# Patient Record
Sex: Male | Born: 1965 | Race: White | Hispanic: No | Marital: Single | Smoking: Current every day smoker
Health system: Southern US, Community
[De-identification: ages and names within clinical notes are randomized; demographics above are authoritative.]

## PROBLEM LIST (undated history)

## (undated) DIAGNOSIS — M5136 Other intervertebral disc degeneration, lumbar region: Secondary | ICD-10-CM

## (undated) DIAGNOSIS — M1990 Unspecified osteoarthritis, unspecified site: Secondary | ICD-10-CM

## (undated) DIAGNOSIS — Z973 Presence of spectacles and contact lenses: Secondary | ICD-10-CM

## (undated) DIAGNOSIS — I10 Essential (primary) hypertension: Secondary | ICD-10-CM

## (undated) DIAGNOSIS — J439 Emphysema, unspecified: Secondary | ICD-10-CM

## (undated) DIAGNOSIS — M779 Enthesopathy, unspecified: Secondary | ICD-10-CM

## (undated) DIAGNOSIS — IMO0001 Reserved for inherently not codable concepts without codable children: Secondary | ICD-10-CM

## (undated) DIAGNOSIS — G8929 Other chronic pain: Secondary | ICD-10-CM

## (undated) DIAGNOSIS — M549 Dorsalgia, unspecified: Secondary | ICD-10-CM

## (undated) HISTORY — PX: FRACTURE SURGERY: SHX138

## (undated) HISTORY — PX: HERNIA REPAIR: SHX51

## (undated) HISTORY — PX: MULTIPLE TOOTH EXTRACTIONS: SHX2053

## (undated) HISTORY — PX: ANTERIOR CERVICAL DECOMP/DISCECTOMY FUSION: SHX1161

## (undated) HISTORY — PX: BACK SURGERY: SHX140

---

## 2014-10-19 ENCOUNTER — Other Ambulatory Visit: Payer: Self-pay | Admitting: Neurosurgery

## 2014-10-19 DIAGNOSIS — M5136 Other intervertebral disc degeneration, lumbar region: Secondary | ICD-10-CM

## 2014-10-26 ENCOUNTER — Ambulatory Visit
Admission: RE | Admit: 2014-10-26 | Discharge: 2014-10-26 | Disposition: A | Discharge status: Home / Self Care | Payer: Medicaid Other | Source: Ambulatory Visit | Attending: Neurosurgery | Admitting: Neurosurgery

## 2014-10-26 DIAGNOSIS — M5136 Other intervertebral disc degeneration, lumbar region: Secondary | ICD-10-CM

## 2014-10-26 MED ORDER — ONDANSETRON HCL 4 MG/2ML IJ SOLN
4.0000 mg | Freq: Once | INTRAMUSCULAR | Status: AC
  Administered 2014-10-26: 4 mg via INTRAMUSCULAR

## 2014-10-26 MED ORDER — DIAZEPAM 5 MG PO TABS
10.0000 mg | ORAL_TABLET | Freq: Once | ORAL | Status: AC
  Administered 2014-10-26: 10 mg via ORAL

## 2014-10-26 MED ORDER — MEPERIDINE HCL 100 MG/ML IJ SOLN
75.0000 mg | Freq: Once | INTRAMUSCULAR | Status: AC
  Administered 2014-10-26: 75 mg via INTRAMUSCULAR

## 2014-10-26 MED ORDER — IOHEXOL 180 MG/ML  SOLN
15.0000 mL | Freq: Once | INTRAMUSCULAR | Status: AC | PRN
  Administered 2014-10-26: 15 mL via INTRATHECAL

## 2014-10-26 NOTE — Discharge Instructions (Signed)

## 2014-10-28 ENCOUNTER — Other Ambulatory Visit: Payer: Self-pay | Admitting: Neurosurgery

## 2014-11-01 ENCOUNTER — Encounter (HOSPITAL_COMMUNITY): Payer: Self-pay | Admitting: *Deleted

## 2014-11-01 MED ORDER — CEFAZOLIN SODIUM-DEXTROSE 2-3 GM-% IV SOLR
2.0000 g | INTRAVENOUS | Status: AC
  Administered 2014-11-02: 2 g via INTRAVENOUS
  Filled 2014-11-01: qty 50

## 2014-11-01 MED ORDER — MUPIROCIN 2 % EX OINT
1.0000 "application " | TOPICAL_OINTMENT | Freq: Once | CUTANEOUS | Status: AC
  Administered 2014-11-02: 1 via TOPICAL
  Filled 2014-11-01: qty 22

## 2014-11-01 NOTE — Progress Notes (Signed)
Pt denies SOB, chest pain, and being under the care of a cardiologist. Pt denies having an EKG and chest x ray w/i the last year. Pt denies having a stress test, echo, and cardiac cath. Pt made aware to stop taking Aspirin, vitamins and herbal medications. Do not take any NSAIDs ie: Ibuprofen, Advil, Naproxen or any medication containing Aspirin. LVM with Shanda BumpsJessica to make MD aware to sign orders. Please add weight and neck measurement to Sleep Apnea screening to make complete on DOS.

## 2014-11-01 NOTE — H&P (Signed)
Curtis HuxleyJames Benson is an 49 y.o. male.   Chief Complaint: pain in both legs.  HPI: patient seen with his girlfriend complaining of pain in both legs and weakness, being unable to extend or fles his toes. This problem has been present fot 18 months and worse lately.he was seen by a spine surgeon in [place] who told him about the ned for surgery but he declined because he is a smoker  Past Medical History  Diagnosis Date  . Arthritis   . DDD (degenerative disc disease), lumbar   . Wears glasses   . Tendonitis     RUE  . Emphysema of lung   . Chronic back pain     Past Surgical History  Procedure Laterality Date  . Fracture surgery      left ankle  . Back surgery      anterior cervical fusion  . Hernia repair    . Multiple tooth extractions      Family History  Problem Relation Age of Onset  . Diabetes Brother    Social History:  reports that he has been smoking.  He has never used smokeless tobacco. He reports that he drinks alcohol. He reports that he uses illicit drugs (Marijuana).  Allergies: No Known Allergies  No prescriptions prior to admission    No results found for this or any previous visit (from the past 48 hour(s)). No results found.  Review of Systems  Constitutional: Negative.   HENT: Negative.   Eyes: Negative.   Respiratory: Positive for shortness of breath.   Cardiovascular: Negative.   Gastrointestinal: Negative.   Genitourinary: Negative.   Musculoskeletal: Positive for back pain.  Skin: Negative.   Neurological: Positive for focal weakness.  Endo/Heme/Allergies: Negative.   Psychiatric/Behavioral: Positive for depression.    There were no vitals taken for this visit. Physical Exam came to my office with crutches. Hent,nl. Neck, anterior scar,  Cv, nl. Lungs, bilateral wheezing. Abdomen, nl. Extremities, nl. NEURO weakness of DF and PF of both feet. Dtr, 3 plus, no babinski. Lumbar mri shows stenosis from l2 to l5.negative for  spondylolisthesis Assessment/Plan Patient to go ahead with decompression from l2 to l5. He is aware of risks and benefits  Stefanie Hodgens M 11/01/2014, 7:08 PM

## 2014-11-02 ENCOUNTER — Inpatient Hospital Stay (HOSPITAL_COMMUNITY)
Admission: RE | Admit: 2014-11-02 | Discharge: 2014-11-05 | DRG: 515 | Disposition: A | Discharge status: Home Health | Payer: Medicaid Other | Source: Ambulatory Visit | Attending: Neurosurgery | Admitting: Neurosurgery

## 2014-11-02 ENCOUNTER — Encounter (HOSPITAL_COMMUNITY): Payer: Self-pay | Admitting: *Deleted

## 2014-11-02 ENCOUNTER — Inpatient Hospital Stay (HOSPITAL_COMMUNITY): Payer: Medicaid Other | Admitting: Anesthesiology

## 2014-11-02 ENCOUNTER — Encounter (HOSPITAL_COMMUNITY)
Admission: RE | Disposition: A | Discharge status: Home Health | Payer: Medicaid Other | Source: Ambulatory Visit | Attending: Neurosurgery

## 2014-11-02 ENCOUNTER — Inpatient Hospital Stay (HOSPITAL_COMMUNITY): Payer: Medicaid Other

## 2014-11-02 DIAGNOSIS — J438 Other emphysema: Secondary | ICD-10-CM | POA: Diagnosis present

## 2014-11-02 DIAGNOSIS — F1290 Cannabis use, unspecified, uncomplicated: Secondary | ICD-10-CM | POA: Diagnosis present

## 2014-11-02 DIAGNOSIS — M545 Low back pain: Secondary | ICD-10-CM | POA: Diagnosis present

## 2014-11-02 DIAGNOSIS — M48061 Spinal stenosis, lumbar region without neurogenic claudication: Secondary | ICD-10-CM | POA: Diagnosis present

## 2014-11-02 DIAGNOSIS — G9519 Other vascular myelopathies: Secondary | ICD-10-CM | POA: Diagnosis present

## 2014-11-02 DIAGNOSIS — M4806 Spinal stenosis, lumbar region: Principal | ICD-10-CM | POA: Diagnosis present

## 2014-11-02 DIAGNOSIS — Z419 Encounter for procedure for purposes other than remedying health state, unspecified: Secondary | ICD-10-CM

## 2014-11-02 DIAGNOSIS — Z833 Family history of diabetes mellitus: Secondary | ICD-10-CM

## 2014-11-02 DIAGNOSIS — F17210 Nicotine dependence, cigarettes, uncomplicated: Secondary | ICD-10-CM | POA: Diagnosis present

## 2014-11-02 DIAGNOSIS — R509 Fever, unspecified: Secondary | ICD-10-CM

## 2014-11-02 HISTORY — PX: LUMBAR LAMINECTOMY/DECOMPRESSION MICRODISCECTOMY: SHX5026

## 2014-11-02 HISTORY — DX: Enthesopathy, unspecified: M77.9

## 2014-11-02 HISTORY — DX: Unspecified osteoarthritis, unspecified site: M19.90

## 2014-11-02 HISTORY — DX: Emphysema, unspecified: J43.9

## 2014-11-02 HISTORY — DX: Other chronic pain: G89.29

## 2014-11-02 HISTORY — DX: Dorsalgia, unspecified: M54.9

## 2014-11-02 HISTORY — DX: Other intervertebral disc degeneration, lumbar region: M51.36

## 2014-11-02 HISTORY — DX: Presence of spectacles and contact lenses: Z97.3

## 2014-11-02 LAB — CBC
HEMATOCRIT: 47.6 % (ref 39.0–52.0)
HEMOGLOBIN: 15.8 g/dL (ref 13.0–17.0)
MCH: 29.8 pg (ref 26.0–34.0)
MCHC: 33.2 g/dL (ref 30.0–36.0)
MCV: 89.8 fL (ref 78.0–100.0)
Platelets: 245 10*3/uL (ref 150–400)
RBC: 5.3 MIL/uL (ref 4.22–5.81)
RDW: 13.4 % (ref 11.5–15.5)
WBC: 9.3 10*3/uL (ref 4.0–10.5)

## 2014-11-02 LAB — BASIC METABOLIC PANEL
ANION GAP: 7 (ref 5–15)
BUN: 6 mg/dL (ref 6–23)
CHLORIDE: 103 mmol/L (ref 96–112)
CO2: 29 mmol/L (ref 19–32)
CREATININE: 0.99 mg/dL (ref 0.50–1.35)
Calcium: 8.8 mg/dL (ref 8.4–10.5)
GFR calc non Af Amer: >90 mL/min (ref >90)
Glucose, Bld: 86 mg/dL (ref 70–99)
POTASSIUM: 4.7 mmol/L (ref 3.5–5.1)
Sodium: 139 mmol/L (ref 135–145)

## 2014-11-02 LAB — SURGICAL PCR SCREEN
MRSA, PCR: NEGATIVE
STAPHYLOCOCCUS AUREUS: NEGATIVE

## 2014-11-02 SURGERY — LUMBAR LAMINECTOMY/DECOMPRESSION MICRODISCECTOMY 4 LEVEL
Anesthesia: General | Site: Back

## 2014-11-02 MED ORDER — ROCURONIUM BROMIDE 50 MG/5ML IV SOLN
INTRAVENOUS | Status: AC
  Filled 2014-11-02: qty 1

## 2014-11-02 MED ORDER — MENTHOL 3 MG MT LOZG: 1.0000 | LOZENGE | OROMUCOSAL | Status: DC | PRN

## 2014-11-02 MED ORDER — VANCOMYCIN HCL 1000 MG IV SOLR
INTRAVENOUS | Status: AC
  Filled 2014-11-02: qty 2000

## 2014-11-02 MED ORDER — BUPIVACAINE LIPOSOME 1.3 % IJ SUSP
INTRAMUSCULAR | Status: DC | PRN
  Administered 2014-11-02: 20 mL

## 2014-11-02 MED ORDER — NALOXONE HCL 0.4 MG/ML IJ SOLN: 0.4000 mg | INTRAMUSCULAR | Status: DC | PRN

## 2014-11-02 MED ORDER — SODIUM CHLORIDE 0.9 % IJ SOLN
3.0000 mL | Freq: Two times a day (BID) | INTRAMUSCULAR | Status: DC
  Administered 2014-11-02 – 2014-11-05 (×6): 3 mL via INTRAVENOUS

## 2014-11-02 MED ORDER — FENTANYL CITRATE 0.05 MG/ML IJ SOLN
INTRAMUSCULAR | Status: AC
  Filled 2014-11-02: qty 5

## 2014-11-02 MED ORDER — FENTANYL CITRATE 0.05 MG/ML IJ SOLN
INTRAMUSCULAR | Status: DC | PRN
  Administered 2014-11-02: 50 ug via INTRAVENOUS
  Administered 2014-11-02 (×2): 100 ug via INTRAVENOUS

## 2014-11-02 MED ORDER — ALBUTEROL SULFATE (2.5 MG/3ML) 0.083% IN NEBU: 2.5000 mg | INHALATION_SOLUTION | RESPIRATORY_TRACT | Status: DC | PRN

## 2014-11-02 MED ORDER — DIPHENHYDRAMINE HCL 12.5 MG/5ML PO ELIX: 12.5000 mg | ORAL_SOLUTION | Freq: Four times a day (QID) | ORAL | Status: DC | PRN

## 2014-11-02 MED ORDER — PHENOL 1.4 % MT LIQD: 1.0000 | OROMUCOSAL | Status: DC | PRN

## 2014-11-02 MED ORDER — GLYCOPYRROLATE 0.2 MG/ML IJ SOLN
INTRAMUSCULAR | Status: AC
  Filled 2014-11-02: qty 3

## 2014-11-02 MED ORDER — THROMBIN 5000 UNITS EX SOLR
CUTANEOUS | Status: DC | PRN
  Administered 2014-11-02: 10000 [IU] via TOPICAL

## 2014-11-02 MED ORDER — LIDOCAINE HCL (CARDIAC) 20 MG/ML IV SOLN
INTRAVENOUS | Status: AC
  Filled 2014-11-02: qty 5

## 2014-11-02 MED ORDER — ONDANSETRON HCL 4 MG/2ML IJ SOLN: 4.0000 mg | Freq: Four times a day (QID) | INTRAMUSCULAR | Status: DC | PRN

## 2014-11-02 MED ORDER — PROPOFOL 10 MG/ML IV BOLUS
INTRAVENOUS | Status: AC
  Filled 2014-11-02: qty 20

## 2014-11-02 MED ORDER — DIAZEPAM 5 MG PO TABS
5.0000 mg | ORAL_TABLET | Freq: Four times a day (QID) | ORAL | Status: DC | PRN
  Administered 2014-11-03 – 2014-11-05 (×4): 5 mg via ORAL
  Filled 2014-11-02 (×4): qty 1

## 2014-11-02 MED ORDER — MIDAZOLAM HCL 2 MG/2ML IJ SOLN
INTRAMUSCULAR | Status: AC
  Filled 2014-11-02: qty 2

## 2014-11-02 MED ORDER — CEFAZOLIN SODIUM 1-5 GM-% IV SOLN
1.0000 g | Freq: Three times a day (TID) | INTRAVENOUS | Status: AC
  Administered 2014-11-02 – 2014-11-03 (×2): 1 g via INTRAVENOUS
  Filled 2014-11-02 (×2): qty 50

## 2014-11-02 MED ORDER — EPHEDRINE SULFATE 50 MG/ML IJ SOLN
INTRAMUSCULAR | Status: DC | PRN
  Administered 2014-11-02: 10 mg via INTRAVENOUS
  Administered 2014-11-02: 5 mg via INTRAVENOUS

## 2014-11-02 MED ORDER — PROMETHAZINE HCL 25 MG/ML IJ SOLN: 6.2500 mg | INTRAMUSCULAR | Status: DC | PRN

## 2014-11-02 MED ORDER — SUCCINYLCHOLINE CHLORIDE 20 MG/ML IJ SOLN
INTRAMUSCULAR | Status: AC
  Filled 2014-11-02: qty 1

## 2014-11-02 MED ORDER — ROCURONIUM BROMIDE 100 MG/10ML IV SOLN
INTRAVENOUS | Status: DC | PRN
  Administered 2014-11-02: 40 mg via INTRAVENOUS
  Administered 2014-11-02 (×2): 10 mg via INTRAVENOUS

## 2014-11-02 MED ORDER — DIPHENHYDRAMINE HCL 50 MG/ML IJ SOLN: 12.5000 mg | Freq: Four times a day (QID) | INTRAMUSCULAR | Status: DC | PRN

## 2014-11-02 MED ORDER — BUPIVACAINE LIPOSOME 1.3 % IJ SUSP
20.0000 mL | INTRAMUSCULAR | Status: AC
  Filled 2014-11-02: qty 20

## 2014-11-02 MED ORDER — SODIUM CHLORIDE 0.9 % IJ SOLN: 3.0000 mL | INTRAMUSCULAR | Status: DC | PRN

## 2014-11-02 MED ORDER — LIDOCAINE HCL (CARDIAC) 20 MG/ML IV SOLN
INTRAVENOUS | Status: DC | PRN
  Administered 2014-11-02: 75 mg via INTRAVENOUS

## 2014-11-02 MED ORDER — TIOTROPIUM BROMIDE MONOHYDRATE 18 MCG IN CAPS
18.0000 ug | ORAL_CAPSULE | Freq: Every day | RESPIRATORY_TRACT | Status: DC
  Administered 2014-11-03 – 2014-11-04 (×2): 18 ug via RESPIRATORY_TRACT
  Filled 2014-11-02: qty 5

## 2014-11-02 MED ORDER — MORPHINE SULFATE (PF) 1 MG/ML IV SOLN
INTRAVENOUS | Status: AC
  Administered 2014-11-02: 10.5 mg
  Administered 2014-11-02: 1.5 mg via INTRAVASCULAR
  Filled 2014-11-02: qty 25

## 2014-11-02 MED ORDER — ALBUMIN HUMAN 5 % IV SOLN
INTRAVENOUS | Status: DC | PRN
  Administered 2014-11-02: 14:00:00 via INTRAVENOUS

## 2014-11-02 MED ORDER — SODIUM CHLORIDE 0.9 % IV SOLN
INTRAVENOUS | Status: DC
  Administered 2014-11-02: 15:00:00 via INTRAVENOUS
  Administered 2014-11-03: 1000 mL via INTRAVENOUS

## 2014-11-02 MED ORDER — ARTIFICIAL TEARS OP OINT
TOPICAL_OINTMENT | OPHTHALMIC | Status: DC | PRN
  Administered 2014-11-02: 1 via OPHTHALMIC

## 2014-11-02 MED ORDER — LACTATED RINGERS IV SOLN
INTRAVENOUS | Status: DC | PRN
  Administered 2014-11-02 (×2): via INTRAVENOUS

## 2014-11-02 MED ORDER — ALBUTEROL SULFATE HFA 108 (90 BASE) MCG/ACT IN AERS: 2.0000 | INHALATION_SPRAY | RESPIRATORY_TRACT | Status: DC | PRN

## 2014-11-02 MED ORDER — PROPOFOL 10 MG/ML IV BOLUS
INTRAVENOUS | Status: DC | PRN
  Administered 2014-11-02: 160 mg via INTRAVENOUS
  Administered 2014-11-02: 40 mg via INTRAVENOUS

## 2014-11-02 MED ORDER — SODIUM CHLORIDE 0.9 % IV SOLN: 250.0000 mL | INTRAVENOUS | Status: DC

## 2014-11-02 MED ORDER — MIDAZOLAM HCL 5 MG/5ML IJ SOLN
INTRAMUSCULAR | Status: DC | PRN
  Administered 2014-11-02 (×2): 1 mg via INTRAVENOUS

## 2014-11-02 MED ORDER — HYDROMORPHONE HCL 1 MG/ML IJ SOLN
0.2500 mg | INTRAMUSCULAR | Status: DC | PRN
  Administered 2014-11-02 (×4): 0.5 mg via INTRAVENOUS

## 2014-11-02 MED ORDER — ONDANSETRON HCL 4 MG/2ML IJ SOLN: 4.0000 mg | INTRAMUSCULAR | Status: DC | PRN

## 2014-11-02 MED ORDER — ARTIFICIAL TEARS OP OINT
TOPICAL_OINTMENT | OPHTHALMIC | Status: AC
  Filled 2014-11-02: qty 3.5

## 2014-11-02 MED ORDER — NEOSTIGMINE METHYLSULFATE 10 MG/10ML IV SOLN
INTRAVENOUS | Status: AC
  Filled 2014-11-02: qty 1

## 2014-11-02 MED ORDER — EPHEDRINE SULFATE 50 MG/ML IJ SOLN
INTRAMUSCULAR | Status: AC
  Filled 2014-11-02: qty 1

## 2014-11-02 MED ORDER — NEOSTIGMINE METHYLSULFATE 10 MG/10ML IV SOLN
INTRAVENOUS | Status: DC | PRN
  Administered 2014-11-02: 4 mg via INTRAVENOUS

## 2014-11-02 MED ORDER — HYDROMORPHONE HCL 1 MG/ML IJ SOLN
INTRAMUSCULAR | Status: AC
  Filled 2014-11-02: qty 1

## 2014-11-02 MED ORDER — PHENYLEPHRINE HCL 10 MG/ML IJ SOLN
INTRAMUSCULAR | Status: DC | PRN
  Administered 2014-11-02 (×2): 80 ug via INTRAVENOUS

## 2014-11-02 MED ORDER — SENNA 8.6 MG PO TABS
1.0000 | ORAL_TABLET | Freq: Two times a day (BID) | ORAL | Status: DC
  Administered 2014-11-02 – 2014-11-05 (×6): 8.6 mg via ORAL
  Filled 2014-11-02 (×6): qty 1

## 2014-11-02 MED ORDER — GLYCOPYRROLATE 0.2 MG/ML IJ SOLN
INTRAMUSCULAR | Status: AC
  Filled 2014-11-02: qty 5

## 2014-11-02 MED ORDER — ZOLPIDEM TARTRATE 5 MG PO TABS: 5.0000 mg | ORAL_TABLET | Freq: Every evening | ORAL | Status: DC | PRN

## 2014-11-02 MED ORDER — OXYCODONE-ACETAMINOPHEN 5-325 MG PO TABS
1.0000 | ORAL_TABLET | ORAL | Status: DC | PRN
  Administered 2014-11-04 – 2014-11-05 (×2): 2 via ORAL
  Filled 2014-11-02 (×2): qty 2

## 2014-11-02 MED ORDER — ONDANSETRON HCL 4 MG/2ML IJ SOLN
INTRAMUSCULAR | Status: DC | PRN
  Administered 2014-11-02: 4 mg via INTRAVENOUS

## 2014-11-02 MED ORDER — ACETAMINOPHEN 650 MG RE SUPP: 650.0000 mg | RECTAL | Status: DC | PRN

## 2014-11-02 MED ORDER — MORPHINE SULFATE (PF) 1 MG/ML IV SOLN
INTRAVENOUS | Status: DC
  Administered 2014-11-02: 16:00:00 via INTRAVENOUS
  Administered 2014-11-02: 1.5 mg via INTRAVENOUS
  Administered 2014-11-02: 12.78 mg via INTRAVENOUS
  Administered 2014-11-03: 25.5 mg via INTRAVENOUS
  Administered 2014-11-03: 09:00:00 via INTRAVENOUS
  Administered 2014-11-03: 13.5 mg via INTRAVENOUS
  Administered 2014-11-03: 25 mg via INTRAVENOUS
  Administered 2014-11-03: 1.5 mg via INTRAVENOUS
  Filled 2014-11-02 (×3): qty 25

## 2014-11-02 MED ORDER — STERILE WATER FOR INJECTION IJ SOLN
INTRAMUSCULAR | Status: AC
  Filled 2014-11-02: qty 10

## 2014-11-02 MED ORDER — ACETAMINOPHEN 325 MG PO TABS
650.0000 mg | ORAL_TABLET | ORAL | Status: DC | PRN
  Administered 2014-11-04 (×2): 650 mg via ORAL
  Filled 2014-11-02 (×2): qty 2

## 2014-11-02 MED ORDER — SODIUM CHLORIDE 0.9 % IJ SOLN: 9.0000 mL | INTRAMUSCULAR | Status: DC | PRN

## 2014-11-02 MED ORDER — GLYCOPYRROLATE 0.2 MG/ML IJ SOLN
INTRAMUSCULAR | Status: DC | PRN
  Administered 2014-11-02: 0.6 mg via INTRAVENOUS

## 2014-11-02 MED ORDER — VANCOMYCIN HCL 1000 MG IV SOLR
INTRAVENOUS | Status: DC | PRN
  Administered 2014-11-02: 2000 mg via TOPICAL

## 2014-11-02 MED ORDER — LACTATED RINGERS IV SOLN
INTRAVENOUS | Status: DC
  Administered 2014-11-02: 11:00:00 via INTRAVENOUS

## 2014-11-02 MED ORDER — ONDANSETRON HCL 4 MG/2ML IJ SOLN
INTRAMUSCULAR | Status: AC
  Filled 2014-11-02: qty 2

## 2014-11-02 SURGICAL SUPPLY — 55 items
BENZOIN TINCTURE PRP APPL 2/3 (GAUZE/BANDAGES/DRESSINGS) ×2 IMPLANT
BLADE CLIPPER SURG (BLADE) IMPLANT
BUR ACORN 6.0 (BURR) ×4 IMPLANT
BUR MATCHSTICK NEURO 3.0 LAGG (BURR) ×2 IMPLANT
CANISTER SUCT 3000ML PPV (MISCELLANEOUS) ×2 IMPLANT
CONT SPEC 4OZ CLIKSEAL STRL BL (MISCELLANEOUS) ×2 IMPLANT
DRAPE LAPAROTOMY 100X72X124 (DRAPES) ×2 IMPLANT
DRAPE MICROSCOPE LEICA (MISCELLANEOUS) ×2 IMPLANT
DRAPE POUCH INSTRU U-SHP 10X18 (DRAPES) ×2 IMPLANT
DRSG PAD ABDOMINAL 8X10 ST (GAUZE/BANDAGES/DRESSINGS) IMPLANT
DURAPREP 26ML APPLICATOR (WOUND CARE) ×2 IMPLANT
ELECT BLADE 4.0 EZ CLEAN MEGAD (MISCELLANEOUS) ×2
ELECT REM PT RETURN 9FT ADLT (ELECTROSURGICAL) ×2
ELECTRODE BLDE 4.0 EZ CLN MEGD (MISCELLANEOUS) ×1 IMPLANT
ELECTRODE REM PT RTRN 9FT ADLT (ELECTROSURGICAL) ×1 IMPLANT
EVACUATOR 3/16  PVC DRAIN (DRAIN) ×1
EVACUATOR 3/16 PVC DRAIN (DRAIN) ×1 IMPLANT
GAUZE SPONGE 4X4 12PLY STRL (GAUZE/BANDAGES/DRESSINGS) ×2 IMPLANT
GAUZE SPONGE 4X4 16PLY XRAY LF (GAUZE/BANDAGES/DRESSINGS) IMPLANT
GLOVE BIOGEL M 8.0 STRL (GLOVE) ×2 IMPLANT
GLOVE ECLIPSE 8.0 STRL XLNG CF (GLOVE) ×2 IMPLANT
GLOVE EXAM NITRILE LRG STRL (GLOVE) IMPLANT
GLOVE EXAM NITRILE MD LF STRL (GLOVE) IMPLANT
GLOVE EXAM NITRILE XL STR (GLOVE) IMPLANT
GLOVE EXAM NITRILE XS STR PU (GLOVE) IMPLANT
GOWN STRL REUS W/ TWL LRG LVL3 (GOWN DISPOSABLE) ×1 IMPLANT
GOWN STRL REUS W/ TWL XL LVL3 (GOWN DISPOSABLE) IMPLANT
GOWN STRL REUS W/TWL 2XL LVL3 (GOWN DISPOSABLE) IMPLANT
GOWN STRL REUS W/TWL LRG LVL3 (GOWN DISPOSABLE) ×1
GOWN STRL REUS W/TWL XL LVL3 (GOWN DISPOSABLE)
KIT BASIN OR (CUSTOM PROCEDURE TRAY) ×2 IMPLANT
KIT ROOM TURNOVER OR (KITS) ×2 IMPLANT
NEEDLE HYPO 18GX1.5 BLUNT FILL (NEEDLE) IMPLANT
NEEDLE HYPO 21X1.5 SAFETY (NEEDLE) IMPLANT
NEEDLE HYPO 25X1 1.5 SAFETY (NEEDLE) IMPLANT
NEEDLE SPNL 20GX3.5 QUINCKE YW (NEEDLE) IMPLANT
NS IRRIG 1000ML POUR BTL (IV SOLUTION) ×2 IMPLANT
PACK LAMINECTOMY NEURO (CUSTOM PROCEDURE TRAY) ×2 IMPLANT
PAD ARMBOARD 7.5X6 YLW CONV (MISCELLANEOUS) ×6 IMPLANT
PATTIES SURGICAL .5 X1 (DISPOSABLE) ×2 IMPLANT
RUBBERBAND STERILE (MISCELLANEOUS) ×4 IMPLANT
SPONGE LAP 4X18 X RAY DECT (DISPOSABLE) IMPLANT
SPONGE SURGIFOAM ABS GEL SZ50 (HEMOSTASIS) ×2 IMPLANT
STRIP CLOSURE SKIN 1/2X4 (GAUZE/BANDAGES/DRESSINGS) ×2 IMPLANT
SUT VIC AB 0 CT1 18XCR BRD8 (SUTURE) ×2 IMPLANT
SUT VIC AB 0 CT1 8-18 (SUTURE) ×2
SUT VIC AB 2-0 CP2 18 (SUTURE) ×4 IMPLANT
SUT VIC AB 3-0 SH 8-18 (SUTURE) ×4 IMPLANT
SYR 20CC LL (SYRINGE) IMPLANT
SYR 20ML ECCENTRIC (SYRINGE) ×2 IMPLANT
SYR 5ML LL (SYRINGE) IMPLANT
TAPE CLOTH SURG 4X10 WHT LF (GAUZE/BANDAGES/DRESSINGS) ×2 IMPLANT
TOWEL OR 17X24 6PK STRL BLUE (TOWEL DISPOSABLE) ×2 IMPLANT
TOWEL OR 17X26 10 PK STRL BLUE (TOWEL DISPOSABLE) ×2 IMPLANT
WATER STERILE IRR 1000ML POUR (IV SOLUTION) ×2 IMPLANT

## 2014-11-02 NOTE — Anesthesia Postprocedure Evaluation (Signed)
  Anesthesia Post-op Note  Patient: Curtis HuxleyJames Pantano  Procedure(s) Performed: Procedure(s) with comments:  Lumbar Laminectomy Lumbar Two, Three, Four, Five (N/A) -  Lumbar Laminectomy Lumbar Two, Three, Four, Five  Patient Location: PACU  Anesthesia Type:General  Level of Consciousness: awake  Airway and Oxygen Therapy: Patient Spontanous Breathing  Post-op Pain: mild  Post-op Assessment: Post-op Vital signs reviewed  Post-op Vital Signs: Reviewed  Last Vitals:  Filed Vitals:   11/02/14 1545  BP: 99/54  Pulse: 65  Temp:   Resp: 19    Complications: No apparent anesthesia complications

## 2014-11-02 NOTE — Anesthesia Preprocedure Evaluation (Signed)
Anesthesia Evaluation  Patient identified by MRN, date of birth, ID band Patient awake    Airway Mallampati: II  TM Distance: >3 FB Neck ROM: Full    Dental   Pulmonary COPDCurrent Smoker,  breath sounds clear to auscultation        Cardiovascular negative cardio ROS  Rhythm:Regular Rate:Normal     Neuro/Psych    GI/Hepatic negative GI ROS, Neg liver ROS,   Endo/Other  negative endocrine ROS  Renal/GU negative Renal ROS     Musculoskeletal  (+) Arthritis -,   Abdominal   Peds negative pediatric ROS (+)  Hematology   Anesthesia Other Findings   Reproductive/Obstetrics                             Anesthesia Physical Anesthesia Plan  ASA: III  Anesthesia Plan: General   Post-op Pain Management:    Induction: Intravenous  Airway Management Planned: Oral ETT  Additional Equipment:   Intra-op Plan:   Post-operative Plan: Extubation in OR  Informed Consent: I have reviewed the patients History and Physical, chart, labs and discussed the procedure including the risks, benefits and alternatives for the proposed anesthesia with the patient or authorized representative who has indicated his/her understanding and acceptance.   Dental advisory given  Plan Discussed with: CRNA and Anesthesiologist  Anesthesia Plan Comments:         Anesthesia Quick Evaluation

## 2014-11-02 NOTE — Anesthesia Procedure Notes (Signed)
Procedure Name: Intubation Date/Time: 11/02/2014 1:05 PM Performed by: Fransisca KaufmannMEYER, Luisdaniel Kenton E Pre-anesthesia Checklist: Patient identified, Emergency Drugs available, Suction available, Patient being monitored and Timeout performed Patient Re-evaluated:Patient Re-evaluated prior to inductionOxygen Delivery Method: Circle system utilized Preoxygenation: Pre-oxygenation with 100% oxygen Intubation Type: IV induction Ventilation: Mask ventilation without difficulty Laryngoscope Size: Miller and 3 Grade View: Grade I Tube type: Oral Tube size: 7.5 mm Number of attempts: 1 Airway Equipment and Method: Stylet Placement Confirmation: ETT inserted through vocal cords under direct vision,  positive ETCO2 and breath sounds checked- equal and bilateral Secured at: 23 cm Tube secured with: Tape Dental Injury: Teeth and Oropharynx as per pre-operative assessment

## 2014-11-02 NOTE — Progress Notes (Signed)
Pt arrived to 4N25 at 1630.  Pt A&O x 4, c/o 7/10 lower back surgical pain, site covered with CDI gauze dressing, hemovac in place.   Pt V/S taken, pt on full dose Morphine PCA, O2 at 2 L,  NS running at 75 cc/hr.  Foley intact, unclamped. Pt without distress. Diet ordered, will monitor.

## 2014-11-02 NOTE — Transfer of Care (Signed)
Immediate Anesthesia Transfer of Care Note  Patient: Curtis Benson  Procedure(s) Performed: Procedure(s) with comments:  Lumbar Laminectomy Lumbar Two, Three, Four, Five (N/A) -  Lumbar Laminectomy Lumbar Two, Three, Four, Five  Patient Location: PACU  Anesthesia Type:General  Level of Consciousness: awake, alert , oriented and patient cooperative  Airway & Oxygen Therapy: Patient Spontanous Breathing  Post-op Assessment: Report given to RN and Post -op Vital signs reviewed and stable  Post vital signs: Reviewed and stable  Last Vitals:  Filed Vitals:   11/02/14 1100  BP: 92/46  Pulse: 69  Temp: 36.3 C  Resp: 18    Complications: No apparent anesthesia complications

## 2014-11-03 ENCOUNTER — Encounter (HOSPITAL_COMMUNITY): Payer: Self-pay | Admitting: Neurosurgery

## 2014-11-03 MED ORDER — OXYCODONE HCL 5 MG PO TABS
10.0000 mg | ORAL_TABLET | ORAL | Status: DC | PRN
  Administered 2014-11-03 – 2014-11-05 (×9): 10 mg via ORAL
  Filled 2014-11-03 (×9): qty 2

## 2014-11-03 MED FILL — Sodium Chloride IV Soln 0.9%: INTRAVENOUS | Qty: 1000 | Status: AC

## 2014-11-03 MED FILL — Heparin Sodium (Porcine) Inj 1000 Unit/ML: INTRAMUSCULAR | Qty: 30 | Status: AC

## 2014-11-03 NOTE — Op Note (Signed)
NAMMarland Kitchen:  [name], Curtis                 ACCOUNT NO.:  [id number]  MEDICAL RECORD NO.:  [id number]  LOCATION:  [id number]                        FACILITY:  [facility]  PHYSICIAN:  Hilda LiasErnesto Yeimy Brabant, M.D.   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  POSTOPERATIVE DIAGNOSIS:  Severe lumbar stenosis with neurogenic claudication from L2 to L5-S1.  PROCEDURE:  Bilateral L2, L3, L4, L5 laminectomy, foraminotomy, decompression of the thecal sac and the foramen from L2 to L5-S1. Microscope.  SURGEON:  Hilda LiasErnesto Oluwadarasimi Favor, M.D.  ASSISTANT:  Dr. Gerlene FeeKritzer.  CLINICAL HISTORY:  Mr. Curtis Benson is a 49 year old gentleman complaining of back pain, worsened to both legs, associated with weakness.  The patient barely can walk because of the pain.  He flexes both knees typically in pain.  He has weakness on dorsiflexion of both legs.  The myelogram showed severe stenosis from L2 down to L5-S1.  Surgery was advised.  He knew the risk and benefits.  PROCEDURE:  The patient was taken to the OR, and after intubation, he was positioned in a prone manner.  The back was cleaned with DuraPrep and drapes were applied.  Midline incision counting from L2 down to L5- S1 was made.  Muscles were retracted all the way laterally beyond the facet.  Then, we proceeded with the removal of spinous process of 5, 4, 3, and 2.  With the drill, we started doing laminectomy from L2 to L5- S1.  We found that the patient had quite a bit of significant stenosis plus thickening with calcification of the yellow ligament.  With the help of the microscope and using the 1 and 2 mm Kerrison punch, we did laminectomies from L2 to L5 with foramen to decompress the L2, L3, L4, L5, and S1 nerve roots bilaterally.  At the end, we had plenty of space not only for the thecal sac but for  every single nerve root.  Because the L1-L2 was normal, the laminectomy at the level of L2 was 3 quadrant with preservation above the ligament and the spinous process.  From then on, the area was irrigated.  Valsalva maneuver was negative.  Vancomycin powder was left in the operative site, and the wound was closed with different layers of Vicryl and Steri-Strips.  Hemovac was left in the operative field.  The patient is going to go to PACU.  He is going to be seen by PT and OT.          ______________________________ Hilda LiasErnesto Wilmer Santillo, M.D.    EB/MEDQ  D:  11/02/2014  T:  11/03/2014  Job:  [id number]

## 2014-11-03 NOTE — Progress Notes (Signed)
Patient is a smoker and he was not prescribed a nicotine patch, I called the on call service to obtain an order for one but never received a call back. Patient said he could hold out until the AM. He is also having a lot of pain, will continue to monitor.

## 2014-11-03 NOTE — Progress Notes (Signed)
CARE MANAGEMENT NOTE 11/03/2014  Patient:  Curtis Benson,Curtis Benson   Account Number:  [id number]  Date Initiated:  11/03/2014  Documentation initiated by:  Jiles CrockerHANDLER,Ataya Murdy  Subjective/Objective Assessment:   ADMITTED FOR SURGERY     Action/Plan:   CM FOLLOWING FOR DCP   Anticipated DC Date:  11/05/2014   Anticipated DC Plan:  POSSIBLY IP REHAB FACILITY     DC Planning Services  CM consult        Status of service:  In process, will continue to follow  Per UR Regulation:  Reviewed for med. necessity/level of care/duration of stay  Comments:  3/2/2016Abelino Derrick- B Zayvian Mcmurtry RN,BSN,MHA [phone]

## 2014-11-03 NOTE — Evaluation (Signed)
Physical Therapy Evaluation Patient Details Name: Curtis HuxleyJames Benson MRN: [id number] DOB: [birth year] Today's Date: 11/03/2014   History of Present Illness  s/p lumbar laminectomy/decompression microdiscectomy 4 level; h/o tendonitis RUE, arthritis, and emphysema   Clinical Impression  Pt admitted with above diagnosis. Pt currently with functional limitations due to the deficits listed below (see PT Problem List). Pt experienced increased pain bilateral hips with ambulation which limited distance. Encouraged to ambulate again with nursing later today. Pt will benefit from skilled PT to increase their independence and safety with mobility to allow discharge to the venue listed below.       Follow Up Recommendations Home health PT;Supervision for mobility/OOB    Equipment Recommendations  Rolling walker with 5" wheels    Recommendations for Other Services       Precautions / Restrictions Precautions Precautions: Back Precaution Booklet Issued: Yes (comment) Precaution Comments: reviewed BAT precautions Restrictions Weight Bearing Restrictions: No      Mobility  Bed Mobility Overal bed mobility: Needs Assistance Bed Mobility: Sit to Supine       Sit to supine: Min assist   General bed mobility comments: min A to get legs into bed and vc's for precautions, min A to position once supine  Transfers Overall transfer level: Needs assistance Equipment used: Rolling walker (2 wheeled) Transfers: Sit to/from Stand Sit to Stand: Min guard         General transfer comment: vc's for hand placement, min-guard for safety  Ambulation/Gait Ambulation/Gait assistance: Min guard Ambulation Distance (Feet): 50 Feet Assistive device: Rolling walker (2 wheeled) Gait Pattern/deviations: Antalgic;Step-through pattern;Decreased stride length Gait velocity: decreased Gait velocity interpretation: Below normal speed for age/gender General Gait Details: pt began to have increased pain in  bilateral hips at 25' of ambulation. Decreased stride length due to pain, increased WB'ing through UE's for pain control  Stairs            Wheelchair Mobility    Modified Rankin (Stroke Patients Only)       Balance Overall balance assessment: Needs assistance Sitting-balance support: No upper extremity supported;Feet supported Sitting balance-Leahy Scale: Good     Standing balance support: Single extremity supported;During functional activity Standing balance-Leahy Scale: Poor Standing balance comment: unable to stand without support due to pain today                             Pertinent Vitals/Pain Pain Assessment: 0-10 Pain Score: 7  Pain Location: low back Pain Intervention(s): Monitored during session    Home Living Family/patient expects to be discharged to:: Private residence Living Arrangements: Spouse/significant other;Children Available Help at Discharge: Family;Available 24 hours/day Type of Home: Mobile home Home Access: Stairs to enter Entrance Stairs-Rails: Doctor, general practiceight;Left Entrance Stairs-Number of Steps: 3 Home Layout: One level Home Equipment: Crutches;Cane - single point Additional Comments: pt reports he uses a plastic chair as a shower seat, recommended placing a nonslip mat/towel underneath.    Prior Function Level of Independence: Independent with assistive device(s)         Comments: crutches,cane to ambulate. Pt is an Administratorauto mechanic     Hand Dominance   Dominant Hand: Right    Extremity/Trunk Assessment   Upper Extremity Assessment: Defer to OT evaluation           Lower Extremity Assessment: RLE deficits/detail;LLE deficits/detail RLE Deficits / Details: hip flex 3-/5, knee and ankle WFL LLE Deficits / Details: hip flex 3-/5. [facility] and ankle [facility]  Cervical / Trunk Assessment: Normal  Communication   Communication: No difficulties  Cognition Arousal/Alertness: Awake/alert Behavior During Therapy: WFL for tasks  assessed/performed Overall Cognitive Status: Within Functional Limits for tasks assessed                      General Comments General comments (skin integrity, edema, etc.): vc's for breathing throughout, pt tends to hold breath. Pt is a smoker, encouraged in quitting to promote healing    Exercises        Assessment/Plan    PT Assessment Patient needs continued PT services  PT Diagnosis Difficulty walking;Acute pain   PT Problem List Decreased strength;Decreased activity tolerance;Decreased balance;Decreased mobility;Decreased knowledge of use of DME;Decreased knowledge of precautions;Pain  PT Treatment Interventions DME instruction;Gait training;Stair training;Functional mobility training;Therapeutic activities;Therapeutic exercise;Balance training;Patient/family education   PT Goals (Current goals can be found in the Care Plan section) Acute Rehab PT Goals Patient Stated Goal: return home PT Goal Formulation: With patient Time For Goal Achievement: 11/10/14 Potential to Achieve Goals: Good    Frequency Min 5X/week   Barriers to discharge        Co-evaluation               End of Session Equipment Utilized During Treatment: Gait belt Activity Tolerance: Patient limited by pain Patient left: in bed;with call bell/phone within reach;with bed alarm set Nurse Communication: Mobility status         Time: 4098-1191 PT Time Calculation (min) (ACUTE ONLY): 30 min   Charges:   PT Evaluation $Initial PT Evaluation Tier I: 1 Procedure PT Treatments $Gait Training: 8-22 mins   PT G Codes:      Lyanne Co, PT  Acute Rehab Services  [phone]   Lyanne Co 11/03/2014, 1:36 PM

## 2014-11-03 NOTE — Evaluation (Signed)
Occupational Therapy Evaluation Patient Details Name: Curtis Benson MRN: [id number] DOB: July 01, 1966 Today's Date: 11/03/2014    History of Present Illness s/p lumbar laminectomy/decompression microdiscectomy 4 level; h/o tendonitis RUE, arthritis, and emphysema    Clinical Impression   Pt admitted with the above diagnoses and presents with below problem list. Pt will benefit from continued acute OT to address the below listed deficits and maximize independence with basic ADLs prior to d/c home with family. PTA pt was mod I with ADLs using AD for ambulation. Pt currently at min guard to min A level for ADLs.     Follow Up Recommendations  Supervision/Assistance - 24 hour;No OT follow up    Equipment Recommendations  None recommended by OT;Other (comment) (pt declined 3n1, shower seat )    Recommendations for Other Services       Precautions / Restrictions Precautions Precautions: Back Precaution Booklet Issued: No Precaution Comments: reviewed BAT precautions Required Braces or Orthoses:  (per Dr. Jeral FruitBotero pt will have a brace "for comfort" and ok to ambulate without brace ) Restrictions Weight Bearing Restrictions: No      Mobility Bed Mobility Overal bed mobility: Needs Assistance Bed Mobility: Rolling;Sidelying to Sit Rolling: Min guard Sidelying to sit: Min assist       General bed mobility comments: min A to powerup trunk. pt's first time OOB since surgery.  Transfers Overall transfer level: Needs assistance Equipment used: Rolling walker (2 wheeled) Transfers: Sit to/from UGI CorporationStand;Stand Pivot Transfers Sit to Stand: Min assist Stand pivot transfers: Min guard       General transfer comment: light min A for balance on initial sit>stand with bed raised     Balance Overall balance assessment: Needs assistance Sitting-balance support: No upper extremity supported;Feet supported Sitting balance-Leahy Scale: Fair     Standing balance support: Bilateral upper  extremity supported;During functional activity Standing balance-Leahy Scale: Poor Standing balance comment: relies on rw for balance                            ADL Overall ADL's : Needs assistance/impaired Eating/Feeding: Set up;Sitting   Grooming: Set up;Sitting   Upper Body Bathing: Set up;Sitting   Lower Body Bathing: Minimal assistance;Sit to/from stand   Upper Body Dressing : Set up;Sitting   Lower Body Dressing: Minimal assistance;Sit to/from stand   Toilet Transfer: Ambulation;RW;Comfort height toilet;Grab bars;Min guard   Toileting- Clothing Manipulation and Hygiene: Sit to/from stand;Minimal assistance   Tub/ Shower Transfer: Ambulation;Rolling walker;Shower seat;Min guard   Functional mobility during ADLs: Min guard;Rolling walker General ADL Comments: Pt educated on back precautions and techniques and AE to complete ADLs while observing precautions. Pt completed bed mobility and stand pivot to recliner with 4-5 steps completed. Educated on rw technique for ambulation and transfers.     Vision     Perception     Praxis      Pertinent Vitals/Pain Pain Assessment: 0-10 Pain Score: 7  Pain Location: lower back Pain Descriptors / Indicators: Pressure Pain Intervention(s): Limited activity within patient's tolerance;Monitored during session;Repositioned;Utilized relaxation techniques;PCA encouraged     Hand Dominance     Extremity/Trunk Assessment Upper Extremity Assessment Upper Extremity Assessment: Overall WFL for tasks assessed   Lower Extremity Assessment Lower Extremity Assessment: Defer to PT evaluation       Communication Communication Communication: No difficulties   Cognition Arousal/Alertness: Awake/alert Behavior During Therapy: WFL for tasks assessed/performed Overall Cognitive Status: Within Functional Limits for tasks assessed  General Comments       Exercises       Shoulder Instructions       Home Living Family/patient expects to be discharged to:: Private residence Living Arrangements: Spouse/significant other;Children Available Help at Discharge: Family;Available 24 hours/day Type of Home: Mobile home Home Access: Stairs to enter Entrance Stairs-Number of Steps: 3 Entrance Stairs-Rails: Right;Left Home Layout: One level     Bathroom Shower/Tub: Chief Strategy Officer: Standard Bathroom Accessibility: Yes How Accessible: Accessible via walker (will need to sidestep) Home Equipment: Crutches;Other (comment) (cane)   Additional Comments: pt reports he uses a plastic chair as a shower seat, recommended placing a nonslip mat/towel underneath.      Prior Functioning/Environment Level of Independence: Independent with assistive device(s)        Comments: crutches,cane to ambulate    OT Diagnosis: Acute pain   OT Problem List: Impaired balance (sitting and/or standing);Decreased knowledge of use of DME or AE;Decreased knowledge of precautions;Pain   OT Treatment/Interventions: Self-care/ADL training;Energy conservation;DME and/or AE instruction;Therapeutic activities;Patient/family education;Balance training    OT Goals(Current goals can be found in the care plan section) Acute Rehab OT Goals Patient Stated Goal: to walk better OT Goal Formulation: With patient Time For Goal Achievement: 11/10/14 Potential to Achieve Goals: Good ADL Goals Pt Will Perform Grooming: with modified independence;standing Pt Will Perform Lower Body Bathing: with modified independence;with adaptive equipment;sit to/from stand Pt Will Perform Lower Body Dressing: with modified independence;with adaptive equipment;sit to/from stand Pt Will Transfer to Toilet: with modified independence;ambulating;regular height toilet;grab bars Pt Will Perform Toileting - Clothing Manipulation and hygiene: with modified independence;sit to/from stand;with adaptive equipment Pt Will Perform  Tub/Shower Transfer: with modified independence;ambulating;shower seat;rolling walker Additional ADL Goal #1: Pt will complete logrolling technique with mod I to prepare for OOB ADLs.  OT Frequency: Min 2X/week   Barriers to D/C:            Co-evaluation              End of Session Equipment Utilized During Treatment: Gait belt;Rolling walker;Oxygen Nurse Communication: Other (comment) (MD ordering back brace "for comfort" ok to ambulate without )  Activity Tolerance: Patient tolerated treatment well;Patient limited by pain Patient left: in chair;with call bell/phone within reach;with chair alarm set   Time: [phone] OT Time Calculation (min): 30 min Charges:  OT General Charges $OT Visit: 1 Procedure OT Evaluation $Initial OT Evaluation Tier I: 1 Procedure OT Treatments $Self Care/Home Management : 8-22 mins G-Codes:    [name] 2014-11-21, 10:06 AM

## 2014-11-03 NOTE — Progress Notes (Signed)
Patient ID: Curtis HuxleyJames Benson, male   DOB: 1965-11-18, 49 y.o.   MRN: [id number] C/o incisional pain. Ambulating with help. rahabilitation to see

## 2014-11-04 ENCOUNTER — Inpatient Hospital Stay (HOSPITAL_COMMUNITY): Payer: Medicaid Other

## 2014-11-04 LAB — EXPECTORATED SPUTUM ASSESSMENT W GRAM STAIN, RFLX TO RESP C: Special Requests: NORMAL

## 2014-11-04 LAB — EXPECTORATED SPUTUM ASSESSMENT W REFEX TO RESP CULTURE

## 2014-11-04 NOTE — Progress Notes (Signed)
Patient ID: Lois HuxleyJames Despain, male   DOB: [birth year], 49 y.o.   MRN: [id number] Called temp 103.1. Cough . To get chest ray,cultures

## 2014-11-04 NOTE — Progress Notes (Signed)
Physical Therapy Treatment Patient Details Name: Lois HuxleyJames Karpowicz MRN: [id number] DOB: 1966-01-09 Today's Date: 11/04/2014    History of Present Illness s/p lumbar laminectomy/decompression microdiscectomy 4 level; h/o tendonitis RUE, arthritis, and emphysema     PT Comments    Patient is progressing with ambulation today and able to complete stair training. Patient starting to get somewhat anxious due to "not being able to smoke". Patient stated that he was ready to go home today. Will have 24/7 assistance. Patient safe to D/C from a mobility standpoint based on progression towards goals set on PT eval.    Follow Up Recommendations  Home health PT;Supervision for mobility/OOB     Equipment Recommendations  Rolling walker with 5" wheels    Recommendations for Other Services       Precautions / Restrictions Precautions Precautions: Back Precaution Comments: reviewed BAT precautions    Mobility  Bed Mobility               General bed mobility comments: Patient up in recliner before and after session  Transfers Overall transfer level: Needs assistance Equipment used: Rolling walker (2 wheeled)   Sit to Stand: Supervision         General transfer comment: vc's for hand placement, supervision for safety  Ambulation/Gait Ambulation/Gait assistance: Supervision Ambulation Distance (Feet): 300 Feet Assistive device: Rolling walker (2 wheeled) Gait Pattern/deviations: Step-through pattern;Decreased stride length;Trunk flexed     General Gait Details: Cues for upright posture and to decreased weight thorugh UEs on RW   Stairs Stairs: Yes Stairs assistance: Min guard Stair Management: Step to pattern;Forwards;One rail Right Number of Stairs: 3 General stair comments: Patient demos safe step technique. Minguard for safety  Wheelchair Mobility    Modified Rankin (Stroke Patients Only)       Balance                                    Cognition  Arousal/Alertness: Awake/alert Behavior During Therapy: WFL for tasks assessed/performed Overall Cognitive Status: Within Functional Limits for tasks assessed                      Exercises      General Comments        Pertinent Vitals/Pain Pain Score: 6  Pain Location: low back Pain Descriptors / Indicators: Sore Pain Intervention(s): Monitored during session    Home Living Family/patient expects to be discharged to:: Private residence Living Arrangements: Spouse/significant other;Children                  Prior Function            PT Goals (current goals can now be found in the care plan section) Progress towards PT goals: Progressing toward goals    Frequency  Min 5X/week    PT Plan Current plan remains appropriate    Co-evaluation             End of Session Equipment Utilized During Treatment: Gait belt Activity Tolerance: Patient tolerated treatment well Patient left: in chair;with call bell/phone within reach;with family/visitor present     Time: 0850-0913 PT Time Calculation (min) (ACUTE ONLY): 23 min  Charges:  $Gait Training: 8-22 mins $Therapeutic Activity: 8-22 mins                    G Codes:      Fredrich BirksRobinette, Julia Elizabeth 11/04/2014, 10:17 AM  11/04/2014 Fredrich Birks PTA [phone] pager [phone] office

## 2014-11-04 NOTE — Progress Notes (Signed)
OT Cancellation Note  Patient Details Name: Lois HuxleyJames Rubiano MRN: [id number] DOB: 02-Aug-1966   Cancelled Treatment:    Reason Eval/Treat Not Completed: Medical issues which prohibited therapy Pt with temp of 103. Will attempt tomorrow if appropriate. [facility]ARY  Patricia Perales, OTR/L  [phone] 11/04/2014 11/04/2014, 3:14 PM

## 2014-11-04 NOTE — Progress Notes (Signed)
Talked to patient about DCP; patient does not want any HHC services at this time; patient stated that his girlfriend is a nurses aide and can help him as needed but he does want a rolling walker; DME ordered - to be delivered to his room prior to discharge home today; Alexis GoodellB Aikam Vinje RN,BSN,MHA [phone]

## 2014-11-04 NOTE — Progress Notes (Signed)
Patient's temp noted at 103.1 orally with productive coughing. MD notified and orders carried out.

## 2014-11-04 NOTE — Progress Notes (Signed)
Pt ambulated in hallway with walker. Approximatey 100 ft.

## 2014-11-04 NOTE — Progress Notes (Signed)
Patient ID: Curtis HuxleyJames Kraner, male   DOB: 01/05/1966, 49 y.o.   MRN: [id number] Stable, less pain. No weakness. Draining less. Wants to go home

## 2014-11-05 NOTE — Discharge Summary (Signed)
Physician Discharge Summary  Patient ID: Lois HuxleyJames Vonada MRN: [id number] DOB/AGE: 11-10-1965 49 y.o.  Admit date: 11/02/2014 Discharge date: 11/05/2014  Admission Diagnoses:lumbar stenosis  Discharge Diagnoses:  Active Problems:   Lumbar stenosis   Discharged Condition: ambulating  Hospital Course: surgery  Consults: none  Significant Diagnostic Studies:mri  Treatments: lumbar decompression  Discharge Exam: Blood pressure 107/51, pulse 70, temperature 100.6 F (38.1 C), temperature source Oral, resp. rate 20, height 5\' 10"  (1.778 m), weight 64.411 kg (142 lb), SpO2 97 %. No weakness. Wound dry. Chest ray negative for pneumonia  Disposition: home     Medication List    ASK your doctor about these medications        albuterol 108 (90 BASE) MCG/ACT inhaler  Commonly known as:  PROVENTIL HFA;VENTOLIN HFA  Inhale 2 puffs into the lungs every 4 (four) hours as needed for wheezing or shortness of breath.     Oxycodone HCl 10 MG Tabs  Take 10 mg by mouth 3 (three) times daily as needed (pain).     tiotropium 18 MCG inhalation capsule  Commonly known as:  SPIRIVA  Place 18 mcg into inhaler and inhale daily.     tizanidine 2 MG capsule  Commonly known as:  ZANAFLEX  Take 2 mg by mouth daily as needed for muscle spasms.         Signed: Karn CassisBOTERO,Nidal Rivet M 11/05/2014, 8:26 AM

## 2014-11-05 NOTE — Progress Notes (Signed)
Physical Therapy Treatment Patient Details Name: Curtis HuxleyJames Benson MRN: [id number] DOB: 1966-03-15 Today's Date: 11/05/2014    History of Present Illness s/p lumbar laminectomy/decompression microdiscectomy 4 level; h/o tendonitis RUE, arthritis, and emphysema     PT Comments    Patient continues to progress well. Patient safe to D/C from a mobility standpoint based on progression towards goals set on PT eval.    Follow Up Recommendations  Home health PT;Supervision for mobility/OOB     Equipment Recommendations  Rolling walker with 5" wheels    Recommendations for Other Services       Precautions / Restrictions Precautions Precautions: Back Precaution Comments: Patien tbal eto recall preacutions Restrictions Weight Bearing Restrictions: No    Mobility  Bed Mobility               General bed mobility comments: Patient up in recliner before and after session  Transfers Overall transfer level: Modified independent                  Ambulation/Gait Ambulation/Gait assistance: Modified independent (Device/Increase time) Ambulation Distance (Feet): 600 Feet             Stairs   Stairs assistance: Supervision Stair Management: Alternating pattern;Forwards;One rail Left Number of Stairs: 3 General stair comments: Patient demos safe step technique.Supervision for safety  Wheelchair Mobility    Modified Rankin (Stroke Patients Only)       Balance                                    Cognition Arousal/Alertness: Awake/alert Behavior During Therapy: WFL for tasks assessed/performed Overall Cognitive Status: Within Functional Limits for tasks assessed                      Exercises      General Comments        Pertinent Vitals/Pain Pain Score: 5  Pain Location: low back Pain Descriptors / Indicators: Sore Pain Intervention(s): Monitored during session    Home Living                      Prior Function             PT Goals (current goals can now be found in the care plan section) Progress towards PT goals: Progressing toward goals    Frequency  Min 5X/week    PT Plan Current plan remains appropriate    Co-evaluation             End of Session   Activity Tolerance: Patient tolerated treatment well Patient left: in chair;with call bell/phone within reach     Time: 1031-1046 PT Time Calculation (min) (ACUTE ONLY): 15 min  Charges:  $Gait Training: 8-22 mins                    G Codes:      Fredrich BirksRobinette, Julia Elizabeth 11/05/2014, 10:57 AM 11/05/2014 Fredrich Birksobinette, Julia Elizabeth PTA [phone] pager [phone] office

## 2014-11-05 NOTE — Progress Notes (Signed)
Patient is discharged from room 4N25 at this time. Alert and in stable condition. IV site d/c'd. Instructions read to patient and understanding verbalized. Left unit via wheelchair with all belongings at side.

## 2014-11-08 LAB — CULTURE, RESPIRATORY

## 2014-11-08 LAB — CULTURE, RESPIRATORY W GRAM STAIN

## 2015-08-01 ENCOUNTER — Other Ambulatory Visit: Payer: Self-pay | Admitting: Neurosurgery

## 2015-08-01 DIAGNOSIS — M5415 Radiculopathy, thoracolumbar region: Secondary | ICD-10-CM

## 2015-08-15 ENCOUNTER — Ambulatory Visit
Admission: RE | Admit: 2015-08-15 | Discharge: 2015-08-15 | Disposition: A | Discharge status: Home / Self Care | Payer: Medicaid Other | Source: Ambulatory Visit | Attending: Neurosurgery | Admitting: Neurosurgery

## 2015-08-15 VITALS — BP 115/75 | HR 63

## 2015-08-15 DIAGNOSIS — M48061 Spinal stenosis, lumbar region without neurogenic claudication: Secondary | ICD-10-CM

## 2015-08-15 DIAGNOSIS — M5415 Radiculopathy, thoracolumbar region: Secondary | ICD-10-CM

## 2015-08-15 MED ORDER — DIAZEPAM 5 MG PO TABS
10.0000 mg | ORAL_TABLET | Freq: Once | ORAL | Status: AC
  Administered 2015-08-15: 10 mg via ORAL

## 2015-08-15 MED ORDER — ONDANSETRON HCL 4 MG/2ML IJ SOLN
4.0000 mg | Freq: Once | INTRAMUSCULAR | Status: AC
  Administered 2015-08-15: 4 mg via INTRAMUSCULAR

## 2015-08-15 MED ORDER — IOHEXOL 180 MG/ML  SOLN
15.0000 mL | Freq: Once | INTRAMUSCULAR | Status: AC | PRN
  Administered 2015-08-15: 15 mL via INTRATHECAL

## 2015-08-15 MED ORDER — MEPERIDINE HCL 100 MG/ML IJ SOLN
75.0000 mg | Freq: Once | INTRAMUSCULAR | Status: AC
  Administered 2015-08-15: 75 mg via INTRAMUSCULAR

## 2015-08-15 NOTE — Progress Notes (Signed)
Patient states he has been off Remeron for the past two days.

## 2015-08-15 NOTE — Discharge Instructions (Signed)
Myelogram Discharge Instructions  1. Go home and rest quietly for the next 24 hours.  It is important to lie flat for the next 24 hours.  Get up only to go to the restroom.  You may lie in the bed or on a couch on your back, your stomach, your left side or your right side.  You may have one pillow under your head.  You may have pillows between your knees while you are on your side or under your knees while you are on your back.  2. DO NOT drive today.  Recline the seat as far back as it will go, while still wearing your seat belt, on the way home.  3. You may get up to go to the bathroom as needed.  You may sit up for 10 minutes to eat.  You may resume your normal diet and medications unless otherwise indicated.  Drink lots of extra fluids today and tomorrow.  4. The incidence of headache, nausea, or vomiting is about 5% (one in 20 patients).  If you develop a headache, lie flat and drink plenty of fluids until the headache goes away.  Caffeinated beverages may be helpful.  If you develop severe nausea and vomiting or a headache that does not go away with flat bed rest, call [phone].  5. You may resume normal activities after your 24 hours of bed rest is over; however, do not exert yourself strongly or do any heavy lifting tomorrow. If when you get up you have a headache when standing, go back to bed and force fluids for another 24 hours.  6. Call your physician for a follow-up appointment.  The results of your myelogram will be sent directly to your physician by the following day.  7. If you have any questions or if complications develop after you arrive home, please call [phone].  Discharge instructions have been explained to the patient.  The patient, or the person responsible for the patient, fully understands these instructions.       May resume Remeron on Dec. 13, 2016, after 9:30 am.

## 2015-08-18 ENCOUNTER — Other Ambulatory Visit: Payer: Self-pay | Admitting: Neurosurgery

## 2015-08-30 ENCOUNTER — Encounter (HOSPITAL_COMMUNITY): Payer: Self-pay

## 2015-08-30 ENCOUNTER — Other Ambulatory Visit (HOSPITAL_COMMUNITY): Payer: Self-pay | Admitting: *Deleted

## 2015-08-30 ENCOUNTER — Encounter (HOSPITAL_COMMUNITY)
Admission: RE | Admit: 2015-08-30 | Discharge: 2015-08-30 | Disposition: A | Discharge status: Home / Self Care | Payer: Medicaid Other | Source: Ambulatory Visit | Attending: Neurosurgery | Admitting: Neurosurgery

## 2015-08-30 ENCOUNTER — Other Ambulatory Visit: Payer: Self-pay

## 2015-08-30 DIAGNOSIS — Z01818 Encounter for other preprocedural examination: Secondary | ICD-10-CM | POA: Diagnosis present

## 2015-08-30 DIAGNOSIS — Z01812 Encounter for preprocedural laboratory examination: Secondary | ICD-10-CM | POA: Insufficient documentation

## 2015-08-30 DIAGNOSIS — R001 Bradycardia, unspecified: Secondary | ICD-10-CM | POA: Diagnosis not present

## 2015-08-30 DIAGNOSIS — M5136 Other intervertebral disc degeneration, lumbar region: Secondary | ICD-10-CM | POA: Insufficient documentation

## 2015-08-30 LAB — COMPREHENSIVE METABOLIC PANEL
ALBUMIN: 3.6 g/dL (ref 3.5–5.0)
ALK PHOS: 69 U/L (ref 38–126)
ALT: 16 U/L — ABNORMAL LOW (ref 17–63)
AST: 22 U/L (ref 15–41)
Anion gap: 8 (ref 5–15)
BILIRUBIN TOTAL: 0.8 mg/dL (ref 0.3–1.2)
BUN: 6 mg/dL (ref 6–20)
CALCIUM: 9.2 mg/dL (ref 8.9–10.3)
CO2: 27 mmol/L (ref 22–32)
CREATININE: 0.91 mg/dL (ref 0.61–1.24)
Chloride: 106 mmol/L (ref 101–111)
GFR calc non Af Amer: >60 mL/min (ref >60)
GLUCOSE: 116 mg/dL — AB (ref 65–99)
Potassium: 4.1 mmol/L (ref 3.5–5.1)
SODIUM: 141 mmol/L (ref 135–145)
Total Protein: 6.6 g/dL (ref 6.5–8.1)

## 2015-08-30 LAB — CBC
HCT: 46.2 % (ref 39.0–52.0)
Hemoglobin: 15 g/dL (ref 13.0–17.0)
MCH: 29.4 pg (ref 26.0–34.0)
MCHC: 32.5 g/dL (ref 30.0–36.0)
MCV: 90.4 fL (ref 78.0–100.0)
Platelets: 245 10*3/uL (ref 150–400)
RBC: 5.11 MIL/uL (ref 4.22–5.81)
RDW: 13 % (ref 11.5–15.5)
WBC: 6.6 10*3/uL (ref 4.0–10.5)

## 2015-08-30 LAB — SURGICAL PCR SCREEN
MRSA, PCR: NEGATIVE
Staphylococcus aureus: NEGATIVE

## 2015-08-30 NOTE — Pre-Procedure Instructions (Addendum)
    Curtis HuxleyJames Brindle  08/30/2015      [facility] - [place], [place] - [address] [place] [address] Phone: [phone] Fax: [phone]    Your procedure is scheduled on Wednesday, September 07, 2015 at 8:30 AM.   [facility]  at 6:30 AM.   Call this number if you have problems the morning of surgery: [phone]   Any questions prior to day of surgery, please call [phone] between 8 & 4 PM.   Remember:  Do not eat food or drink liquids after midnight Tuesday, 09/06/15. Take with a sip of water: oxycodone (if needed)  STOP: ALL Vitamins, Supplements, Effient and Herbal Medications, Fish Oils, Aspirins, NSAIDs (Nonsteroidal Anti-inflammatories such as Ibuprofen, Aleve, or Advil), and Goody's/BC Powders 7 days prior to surgery, until after surgery as directed by your physician.       Do not wear jewelry.  Do not wear lotions, powders, or cologne.  You may wear deodorant.  Do not shave 48 hours prior to surgery.  Men may shave face and neck.  Do not bring valuables to the hospital.  [facility] is not responsible for any belongings or valuables.  Contacts, dentures or bridgework may not be worn into surgery.  Leave your suitcase in the car.  After surgery it may be brought to your room.  For patients admitted to the hospital, discharge time will be determined by your treatment team.  Special instructions:  See "Preparing for Surgery" Instruction sheet.  Please read over the following fact sheets that you were given. Pain Booklet, Coughing and Deep Breathing, MRSA Information and Surgical Site Infection Prevention

## 2015-09-06 MED ORDER — CEFAZOLIN SODIUM-DEXTROSE 2-3 GM-% IV SOLR
2.0000 g | INTRAVENOUS | Status: AC
  Administered 2015-09-07: 2 g via INTRAVENOUS
  Filled 2015-09-06: qty 50

## 2015-09-06 NOTE — H&P (Signed)
Curtis Benson is an 50 y.o. male.   Chief Complaint: right leg pain HPI: patient who underwent lumbar decompression. Did well but lately he has been complaining of right leg pain going all the way to the right foot not better with conservative treatment. He had 3 episodes of losing control of the right foot. Pain pills do not help. hafd a lumbar myelogram  Past Medical History  Diagnosis Date  . Arthritis   . DDD (degenerative disc disease), lumbar   . Wears glasses   . Tendonitis     RUE  . Emphysema of lung (HCC)   . Chronic back pain     Past Surgical History  Procedure Laterality Date  . Fracture surgery      left ankle  . Back surgery      anterior cervical fusion  . Hernia repair    . Multiple tooth extractions    . Lumbar laminectomy/decompression microdiscectomy N/A 11/02/2014    Procedure:  Lumbar Laminectomy Lumbar Two, Three, Four, Five;  Surgeon: Karn CassisErnesto M Azriel Jakob, MD;  Location: [facility];  Service: Neurosurgery;  Laterality: N/A;   Lumbar Laminectomy Lumbar Two, Three, Four, Five    Family History  Problem Relation Age of Onset  . Diabetes Brother    Social History:  reports that he has been smoking.  He has never used smokeless tobacco. He reports that he drinks alcohol. He reports that he uses illicit drugs (Marijuana).  Allergies: No Known Allergies  No prescriptions prior to admission    No results found for this or any previous visit (from the past 48 hour(s)). No results found.  Review of Systems  Eyes: Negative.   Respiratory: Positive for cough and wheezing.   Cardiovascular: Negative.   Musculoskeletal: Positive for back pain and neck pain.  Skin: Negative.   Neurological: Positive for sensory change and focal weakness.  Psychiatric/Behavioral: Positive for depression.    There were no vitals taken for this visit. Physical Exam hent, nl. Neck, anterior scar. Cv, nl. Lungs, wheezing bilaterally. Abdomen, nl. Extremities, nl. NEURO  hypersensitivity around the l5 dermatome. SLR positive at 60 degrees.  Lumbar myelogram showed foraminal degenerative changes bilaterally, worse at 34,45 5-1.  Assessment/Plan rigtr decompression of the foramens at right 34,45 ,5-1. He is aware of risks and benefits including infection, csf leak, no improvement.  Curtis Benson M 09/06/2015, 5:50 PM

## 2015-09-07 ENCOUNTER — Encounter (HOSPITAL_COMMUNITY): Payer: Self-pay | Admitting: Anesthesiology

## 2015-09-07 ENCOUNTER — Encounter (HOSPITAL_COMMUNITY)
Admission: RE | Disposition: A | Discharge status: Home / Self Care | Payer: Self-pay | Source: Ambulatory Visit | Attending: Neurosurgery

## 2015-09-07 ENCOUNTER — Inpatient Hospital Stay (HOSPITAL_COMMUNITY): Payer: Medicaid Other | Admitting: Anesthesiology

## 2015-09-07 ENCOUNTER — Inpatient Hospital Stay (HOSPITAL_COMMUNITY): Payer: Medicaid Other

## 2015-09-07 ENCOUNTER — Inpatient Hospital Stay (HOSPITAL_COMMUNITY)
Admission: RE | Admit: 2015-09-07 | Discharge: 2015-09-09 | DRG: 460 | Disposition: A | Discharge status: Home / Self Care | Payer: Medicaid Other | Source: Ambulatory Visit | Attending: Neurosurgery | Admitting: Neurosurgery

## 2015-09-07 DIAGNOSIS — M47816 Spondylosis without myelopathy or radiculopathy, lumbar region: Secondary | ICD-10-CM | POA: Diagnosis present

## 2015-09-07 DIAGNOSIS — M79604 Pain in right leg: Secondary | ICD-10-CM | POA: Diagnosis present

## 2015-09-07 DIAGNOSIS — Z419 Encounter for procedure for purposes other than remedying health state, unspecified: Secondary | ICD-10-CM

## 2015-09-07 DIAGNOSIS — M4726 Other spondylosis with radiculopathy, lumbar region: Principal | ICD-10-CM | POA: Diagnosis present

## 2015-09-07 DIAGNOSIS — Z981 Arthrodesis status: Secondary | ICD-10-CM

## 2015-09-07 DIAGNOSIS — F17200 Nicotine dependence, unspecified, uncomplicated: Secondary | ICD-10-CM | POA: Diagnosis present

## 2015-09-07 HISTORY — DX: Reserved for inherently not codable concepts without codable children: IMO0001

## 2015-09-07 HISTORY — PX: LUMBAR LAMINECTOMY/DECOMPRESSION MICRODISCECTOMY: SHX5026

## 2015-09-07 SURGERY — LUMBAR LAMINECTOMY/DECOMPRESSION MICRODISCECTOMY 3 LEVELS
Anesthesia: General | Laterality: Right

## 2015-09-07 MED ORDER — PROPOFOL 10 MG/ML IV BOLUS
INTRAVENOUS | Status: DC | PRN
  Administered 2015-09-07: 200 mg via INTRAVENOUS

## 2015-09-07 MED ORDER — ROCURONIUM BROMIDE 100 MG/10ML IV SOLN
INTRAVENOUS | Status: DC | PRN
  Administered 2015-09-07: 50 mg via INTRAVENOUS

## 2015-09-07 MED ORDER — FENTANYL CITRATE (PF) 250 MCG/5ML IJ SOLN
INTRAMUSCULAR | Status: AC
  Filled 2015-09-07: qty 5

## 2015-09-07 MED ORDER — ONDANSETRON HCL 4 MG/2ML IJ SOLN: 4.0000 mg | INTRAMUSCULAR | Status: DC | PRN

## 2015-09-07 MED ORDER — SODIUM CHLORIDE 0.9 % IJ SOLN: 9.0000 mL | INTRAMUSCULAR | Status: DC | PRN

## 2015-09-07 MED ORDER — THROMBIN 5000 UNITS EX SOLR
CUTANEOUS | Status: DC | PRN
  Administered 2015-09-07 (×2): 5000 [IU] via TOPICAL

## 2015-09-07 MED ORDER — MEPERIDINE HCL 25 MG/ML IJ SOLN: 6.2500 mg | INTRAMUSCULAR | Status: DC | PRN

## 2015-09-07 MED ORDER — SODIUM CHLORIDE 0.9 % IJ SOLN: 3.0000 mL | INTRAMUSCULAR | Status: DC | PRN

## 2015-09-07 MED ORDER — PHENYLEPHRINE HCL 10 MG/ML IJ SOLN
INTRAMUSCULAR | Status: DC | PRN
  Administered 2015-09-07: 120 ug via INTRAVENOUS
  Administered 2015-09-07 (×2): 80 ug via INTRAVENOUS

## 2015-09-07 MED ORDER — MAGNESIUM HYDROXIDE 400 MG/5ML PO SUSP: 30.0000 mL | Freq: Every day | ORAL | Status: DC | PRN

## 2015-09-07 MED ORDER — HYDROMORPHONE HCL 1 MG/ML IJ SOLN
INTRAMUSCULAR | Status: AC
  Administered 2015-09-07: 0.5 mg via INTRAVENOUS
  Filled 2015-09-07: qty 1

## 2015-09-07 MED ORDER — 0.9 % SODIUM CHLORIDE (POUR BTL) OPTIME
TOPICAL | Status: DC | PRN
  Administered 2015-09-07: 1000 mL

## 2015-09-07 MED ORDER — DIAZEPAM 5 MG PO TABS
5.0000 mg | ORAL_TABLET | Freq: Four times a day (QID) | ORAL | Status: DC | PRN
  Administered 2015-09-07 – 2015-09-09 (×6): 5 mg via ORAL
  Filled 2015-09-07 (×6): qty 1

## 2015-09-07 MED ORDER — SODIUM CHLORIDE 0.9 % IJ SOLN
INTRAMUSCULAR | Status: AC
  Filled 2015-09-07: qty 10

## 2015-09-07 MED ORDER — BACITRACIN ZINC 500 UNIT/GM EX OINT
TOPICAL_OINTMENT | CUTANEOUS | Status: DC | PRN
  Administered 2015-09-07: 1 via TOPICAL

## 2015-09-07 MED ORDER — ONDANSETRON HCL 4 MG/2ML IJ SOLN: 4.0000 mg | Freq: Four times a day (QID) | INTRAMUSCULAR | Status: DC | PRN

## 2015-09-07 MED ORDER — POTASSIUM CHLORIDE IN NACL 20-0.9 MEQ/L-% IV SOLN
INTRAVENOUS | Status: DC
  Administered 2015-09-07: 15:00:00 via INTRAVENOUS
  Filled 2015-09-07 (×5): qty 1000

## 2015-09-07 MED ORDER — MIDAZOLAM HCL 2 MG/2ML IJ SOLN
0.5000 mg | Freq: Once | INTRAMUSCULAR | Status: AC | PRN
  Administered 2015-09-07: 1 mg via INTRAVENOUS

## 2015-09-07 MED ORDER — DIPHENHYDRAMINE HCL 12.5 MG/5ML PO ELIX: 12.5000 mg | ORAL_SOLUTION | Freq: Four times a day (QID) | ORAL | Status: DC | PRN

## 2015-09-07 MED ORDER — KETAMINE HCL 100 MG/ML IJ SOLN
INTRAMUSCULAR | Status: AC
  Administered 2015-09-07 (×2): 30 mg via INTRAVENOUS
  Filled 2015-09-07: qty 1

## 2015-09-07 MED ORDER — OXYCODONE-ACETAMINOPHEN 5-325 MG PO TABS
ORAL_TABLET | ORAL | Status: AC
  Filled 2015-09-07: qty 2

## 2015-09-07 MED ORDER — ACETAMINOPHEN 650 MG RE SUPP: 650.0000 mg | RECTAL | Status: DC | PRN

## 2015-09-07 MED ORDER — DIPHENHYDRAMINE HCL 50 MG/ML IJ SOLN
INTRAMUSCULAR | Status: AC
  Filled 2015-09-07: qty 1

## 2015-09-07 MED ORDER — SUCCINYLCHOLINE CHLORIDE 20 MG/ML IJ SOLN
INTRAMUSCULAR | Status: AC
Start: 2015-09-07 — End: 2015-09-07
  Filled 2015-09-07: qty 1

## 2015-09-07 MED ORDER — HEMOSTATIC AGENTS (NO CHARGE) OPTIME
TOPICAL | Status: DC | PRN
  Administered 2015-09-07: 1 via TOPICAL

## 2015-09-07 MED ORDER — NALOXONE HCL 0.4 MG/ML IJ SOLN
0.4000 mg | INTRAMUSCULAR | Status: DC | PRN
Start: 2015-09-07 — End: 2015-09-08

## 2015-09-07 MED ORDER — BUPIVACAINE LIPOSOME 1.3 % IJ SUSP
INTRAMUSCULAR | Status: DC | PRN
  Administered 2015-09-07: 20 mL

## 2015-09-07 MED ORDER — ACETAMINOPHEN 325 MG PO TABS: 650.0000 mg | ORAL_TABLET | ORAL | Status: DC | PRN

## 2015-09-07 MED ORDER — ZOLPIDEM TARTRATE 5 MG PO TABS: 5.0000 mg | ORAL_TABLET | Freq: Every evening | ORAL | Status: DC | PRN

## 2015-09-07 MED ORDER — BUPIVACAINE LIPOSOME 1.3 % IJ SUSP
20.0000 mL | Freq: Once | INTRAMUSCULAR | Status: DC
  Filled 2015-09-07: qty 20

## 2015-09-07 MED ORDER — HYDROMORPHONE HCL 1 MG/ML IJ SOLN
0.2500 mg | INTRAMUSCULAR | Status: DC | PRN
  Administered 2015-09-07 (×3): 0.5 mg via INTRAVENOUS

## 2015-09-07 MED ORDER — MENTHOL 3 MG MT LOZG: 1.0000 | LOZENGE | OROMUCOSAL | Status: DC | PRN

## 2015-09-07 MED ORDER — PROMETHAZINE HCL 25 MG/ML IJ SOLN: 6.2500 mg | INTRAMUSCULAR | Status: DC | PRN

## 2015-09-07 MED ORDER — PROPOFOL 10 MG/ML IV BOLUS
INTRAVENOUS | Status: AC
  Filled 2015-09-07: qty 20

## 2015-09-07 MED ORDER — NEOSTIGMINE METHYLSULFATE 10 MG/10ML IV SOLN
INTRAVENOUS | Status: DC | PRN
  Administered 2015-09-07: 3 mg via INTRAVENOUS

## 2015-09-07 MED ORDER — GLYCOPYRROLATE 0.2 MG/ML IJ SOLN
INTRAMUSCULAR | Status: DC | PRN
  Administered 2015-09-07: 0.4 mg via INTRAVENOUS

## 2015-09-07 MED ORDER — MIDAZOLAM HCL 2 MG/2ML IJ SOLN
INTRAMUSCULAR | Status: AC
Start: 2015-09-07 — End: 2015-09-07
  Administered 2015-09-07: 1 mg via INTRAVENOUS
  Filled 2015-09-07: qty 2

## 2015-09-07 MED ORDER — MIDAZOLAM HCL 5 MG/5ML IJ SOLN
INTRAMUSCULAR | Status: DC | PRN
  Administered 2015-09-07: 2 mg via INTRAVENOUS

## 2015-09-07 MED ORDER — LACTATED RINGERS IV SOLN
INTRAVENOUS | Status: DC | PRN
  Administered 2015-09-07 (×2): via INTRAVENOUS

## 2015-09-07 MED ORDER — PHENYLEPHRINE 40 MCG/ML (10ML) SYRINGE FOR IV PUSH (FOR BLOOD PRESSURE SUPPORT)
PREFILLED_SYRINGE | INTRAVENOUS | Status: AC
  Filled 2015-09-07: qty 10

## 2015-09-07 MED ORDER — ONDANSETRON HCL 4 MG/2ML IJ SOLN
INTRAMUSCULAR | Status: AC
  Filled 2015-09-07: qty 2

## 2015-09-07 MED ORDER — CEFAZOLIN SODIUM 1-5 GM-% IV SOLN
1.0000 g | Freq: Three times a day (TID) | INTRAVENOUS | Status: AC
  Administered 2015-09-07 (×2): 1 g via INTRAVENOUS
  Filled 2015-09-07 (×2): qty 50

## 2015-09-07 MED ORDER — OXYCODONE-ACETAMINOPHEN 5-325 MG PO TABS
1.0000 | ORAL_TABLET | ORAL | Status: DC | PRN
  Administered 2015-09-07 – 2015-09-08 (×4): 2 via ORAL
  Filled 2015-09-07 (×3): qty 2

## 2015-09-07 MED ORDER — MORPHINE SULFATE 2 MG/ML IV SOLN
INTRAVENOUS | Status: AC
  Filled 2015-09-07: qty 25

## 2015-09-07 MED ORDER — EPHEDRINE SULFATE 50 MG/ML IJ SOLN
INTRAMUSCULAR | Status: DC | PRN
  Administered 2015-09-07: 15 mg via INTRAVENOUS

## 2015-09-07 MED ORDER — DIPHENHYDRAMINE HCL 50 MG/ML IJ SOLN
INTRAMUSCULAR | Status: DC | PRN
  Administered 2015-09-07: 12.5 mg via INTRAVENOUS

## 2015-09-07 MED ORDER — DIPHENHYDRAMINE HCL 50 MG/ML IJ SOLN: 12.5000 mg | Freq: Four times a day (QID) | INTRAMUSCULAR | Status: DC | PRN

## 2015-09-07 MED ORDER — ROCURONIUM BROMIDE 50 MG/5ML IV SOLN
INTRAVENOUS | Status: AC
  Filled 2015-09-07: qty 1

## 2015-09-07 MED ORDER — VANCOMYCIN HCL 1000 MG IV SOLR
INTRAVENOUS | Status: AC
  Filled 2015-09-07: qty 1000

## 2015-09-07 MED ORDER — PHENOL 1.4 % MT LIQD: 1.0000 | OROMUCOSAL | Status: DC | PRN

## 2015-09-07 MED ORDER — LIDOCAINE HCL (CARDIAC) 20 MG/ML IV SOLN
INTRAVENOUS | Status: AC
  Filled 2015-09-07: qty 5

## 2015-09-07 MED ORDER — NEOSTIGMINE METHYLSULFATE 10 MG/10ML IV SOLN
INTRAVENOUS | Status: AC
  Filled 2015-09-07: qty 1

## 2015-09-07 MED ORDER — MORPHINE SULFATE 2 MG/ML IV SOLN
INTRAVENOUS | Status: DC
  Administered 2015-09-07 – 2015-09-08 (×3): via INTRAVENOUS
  Filled 2015-09-07 (×2): qty 25

## 2015-09-07 MED ORDER — MIDAZOLAM HCL 2 MG/2ML IJ SOLN
INTRAMUSCULAR | Status: AC
  Filled 2015-09-07: qty 2

## 2015-09-07 MED ORDER — LIDOCAINE HCL (CARDIAC) 20 MG/ML IV SOLN
INTRAVENOUS | Status: DC | PRN
  Administered 2015-09-07: 20 mg via INTRAVENOUS

## 2015-09-07 MED ORDER — SODIUM CHLORIDE 0.9 % IJ SOLN: 3.0000 mL | Freq: Two times a day (BID) | INTRAMUSCULAR | Status: DC

## 2015-09-07 MED ORDER — EPHEDRINE SULFATE 50 MG/ML IJ SOLN
INTRAMUSCULAR | Status: AC
  Filled 2015-09-07: qty 1

## 2015-09-07 MED ORDER — DEXAMETHASONE SODIUM PHOSPHATE 10 MG/ML IJ SOLN
INTRAMUSCULAR | Status: DC | PRN
  Administered 2015-09-07: 10 mg via INTRAVENOUS

## 2015-09-07 MED ORDER — ONDANSETRON HCL 4 MG/2ML IJ SOLN
INTRAMUSCULAR | Status: DC | PRN
  Administered 2015-09-07: 4 mg via INTRAVENOUS

## 2015-09-07 MED ORDER — VANCOMYCIN HCL 1000 MG IV SOLR
INTRAVENOUS | Status: DC | PRN
  Administered 2015-09-07: 1000 mg

## 2015-09-07 MED ORDER — GLYCOPYRROLATE 0.2 MG/ML IJ SOLN
INTRAMUSCULAR | Status: AC
  Filled 2015-09-07: qty 2

## 2015-09-07 MED ORDER — FENTANYL CITRATE (PF) 100 MCG/2ML IJ SOLN
INTRAMUSCULAR | Status: DC | PRN
  Administered 2015-09-07: 50 ug via INTRAVENOUS
  Administered 2015-09-07: 100 ug via INTRAVENOUS
  Administered 2015-09-07: 50 ug via INTRAVENOUS
  Administered 2015-09-07: 100 ug via INTRAVENOUS
  Administered 2015-09-07: 50 ug via INTRAVENOUS
  Administered 2015-09-07: 100 ug via INTRAVENOUS
  Administered 2015-09-07: 50 ug via INTRAVENOUS

## 2015-09-07 MED ORDER — DEXAMETHASONE SODIUM PHOSPHATE 10 MG/ML IJ SOLN
INTRAMUSCULAR | Status: AC
  Filled 2015-09-07: qty 1

## 2015-09-07 MED ORDER — SODIUM CHLORIDE 0.9 % IV SOLN: 250.0000 mL | INTRAVENOUS | Status: DC

## 2015-09-07 SURGICAL SUPPLY — 52 items
BENZOIN TINCTURE PRP APPL 2/3 (GAUZE/BANDAGES/DRESSINGS) ×2 IMPLANT
BLADE CLIPPER SURG (BLADE) IMPLANT
BUR ACORN 6.0 (BURR) ×2 IMPLANT
BUR MATCHSTICK NEURO 3.0 LAGG (BURR) ×2 IMPLANT
CANISTER SUCT 3000ML PPV (MISCELLANEOUS) ×2 IMPLANT
DRAPE LAPAROTOMY 100X72X124 (DRAPES) ×2 IMPLANT
DRAPE MICROSCOPE LEICA (MISCELLANEOUS) ×2 IMPLANT
DRAPE POUCH INSTRU U-SHP 10X18 (DRAPES) ×2 IMPLANT
DRSG OPSITE POSTOP 4X8 (GAUZE/BANDAGES/DRESSINGS) ×2 IMPLANT
DRSG PAD ABDOMINAL 8X10 ST (GAUZE/BANDAGES/DRESSINGS) IMPLANT
DURAPREP 26ML APPLICATOR (WOUND CARE) ×2 IMPLANT
ELECT REM PT RETURN 9FT ADLT (ELECTROSURGICAL) ×2
ELECTRODE REM PT RTRN 9FT ADLT (ELECTROSURGICAL) ×1 IMPLANT
GAUZE SPONGE 4X4 12PLY STRL (GAUZE/BANDAGES/DRESSINGS) ×2 IMPLANT
GAUZE SPONGE 4X4 16PLY XRAY LF (GAUZE/BANDAGES/DRESSINGS) IMPLANT
GLOVE BIO SURGEON STRL SZ7 (GLOVE) ×4 IMPLANT
GLOVE BIOGEL M 8.0 STRL (GLOVE) ×2 IMPLANT
GLOVE EXAM NITRILE LRG STRL (GLOVE) IMPLANT
GLOVE EXAM NITRILE MD LF STRL (GLOVE) IMPLANT
GLOVE EXAM NITRILE XL STR (GLOVE) IMPLANT
GLOVE EXAM NITRILE XS STR PU (GLOVE) IMPLANT
GLOVE INDICATOR 7.0 STRL GRN (GLOVE) ×4 IMPLANT
GLOVE INDICATOR 7.5 STRL GRN (GLOVE) ×2 IMPLANT
GOWN STRL REUS W/ TWL LRG LVL3 (GOWN DISPOSABLE) ×1 IMPLANT
GOWN STRL REUS W/ TWL XL LVL3 (GOWN DISPOSABLE) IMPLANT
GOWN STRL REUS W/TWL 2XL LVL3 (GOWN DISPOSABLE) IMPLANT
GOWN STRL REUS W/TWL LRG LVL3 (GOWN DISPOSABLE) ×1
GOWN STRL REUS W/TWL XL LVL3 (GOWN DISPOSABLE)
KIT BASIN OR (CUSTOM PROCEDURE TRAY) ×2 IMPLANT
KIT INFUSE MEDIUM (Orthopedic Implant) ×2 IMPLANT
KIT ROOM TURNOVER OR (KITS) ×2 IMPLANT
NEEDLE HYPO 18GX1.5 BLUNT FILL (NEEDLE) IMPLANT
NEEDLE HYPO 21X1.5 SAFETY (NEEDLE) ×2 IMPLANT
NEEDLE HYPO 25X1 1.5 SAFETY (NEEDLE) IMPLANT
NEEDLE SPNL 20GX3.5 QUINCKE YW (NEEDLE) ×2 IMPLANT
NS IRRIG 1000ML POUR BTL (IV SOLUTION) ×2 IMPLANT
PACK FOAM VITOSS 10CC (Orthopedic Implant) ×2 IMPLANT
PACK LAMINECTOMY NEURO (CUSTOM PROCEDURE TRAY) ×2 IMPLANT
PAD ARMBOARD 7.5X6 YLW CONV (MISCELLANEOUS) ×6 IMPLANT
PATTIES SURGICAL .5 X1 (DISPOSABLE) ×2 IMPLANT
RUBBERBAND STERILE (MISCELLANEOUS) ×4 IMPLANT
SPONGE LAP 4X18 X RAY DECT (DISPOSABLE) IMPLANT
SPONGE SURGIFOAM ABS GEL SZ50 (HEMOSTASIS) ×2 IMPLANT
STRIP CLOSURE SKIN 1/2X4 (GAUZE/BANDAGES/DRESSINGS) ×2 IMPLANT
SUT VIC AB 0 CT1 18XCR BRD8 (SUTURE) ×2 IMPLANT
SUT VIC AB 0 CT1 8-18 (SUTURE) ×2
SUT VIC AB 2-0 CP2 18 (SUTURE) ×4 IMPLANT
SUT VIC AB 3-0 SH 8-18 (SUTURE) ×2 IMPLANT
SYR 5ML LL (SYRINGE) IMPLANT
TOWEL OR 17X24 6PK STRL BLUE (TOWEL DISPOSABLE) ×2 IMPLANT
TOWEL OR 17X26 10 PK STRL BLUE (TOWEL DISPOSABLE) ×2 IMPLANT
WATER STERILE IRR 1000ML POUR (IV SOLUTION) ×2 IMPLANT

## 2015-09-07 NOTE — Progress Notes (Signed)
Paged [employer] for aspen lumbar brace, per [employer], will deliver to patient in AM.

## 2015-09-07 NOTE — Progress Notes (Signed)
Getting pt ready for transfer to 5 c; full dose morphine PCA in use; pt Hx cleared (3 mg)

## 2015-09-07 NOTE — Anesthesia Preprocedure Evaluation (Addendum)
Anesthesia Evaluation  Patient identified by MRN, date of birth, ID band Patient awake    Reviewed: Allergy & Precautions, NPO status , Patient's Chart, lab work & pertinent test results  History of Anesthesia Complications Negative for: history of anesthetic complications  Airway Mallampati: II  TM Distance: >3 FB Neck ROM: Full    Dental  (+) Edentulous Upper, Poor Dentition, Chipped, Missing, Dental Advisory Given   Pulmonary COPD, Current Smoker,    breath sounds clear to auscultation + decreased breath sounds      Cardiovascular (-) anginanegative cardio ROS   Rhythm:Regular Rate:Normal     Neuro/Psych Chronic back pain: narcotics    GI/Hepatic negative GI ROS, (+)     substance abuse  marijuana use,   Endo/Other  negative endocrine ROS  Renal/GU negative Renal ROS     Musculoskeletal  (+) Arthritis ,   Abdominal   Peds  Hematology negative hematology ROS (+)   Anesthesia Other Findings   Reproductive/Obstetrics                            Anesthesia Physical Anesthesia Plan  ASA: II  Anesthesia Plan: General   Post-op Pain Management:    Induction: Intravenous  Airway Management Planned: Oral ETT  Additional Equipment:   Intra-op Plan:   Post-operative Plan: Extubation in OR  Informed Consent: I have reviewed the patients History and Physical, chart, labs and discussed the procedure including the risks, benefits and alternatives for the proposed anesthesia with the patient or authorized representative who has indicated his/her understanding and acceptance.   Dental advisory given  Plan Discussed with: CRNA and Surgeon  Anesthesia Plan Comments: (Plan routine monitors, GETA)        Anesthesia Quick Evaluation

## 2015-09-07 NOTE — Anesthesia Procedure Notes (Signed)
Procedure Name: Intubation Date/Time: 09/07/2015 8:42 AM Performed by: Ferol LuzMCMILLEN, Joey Hudock L Pre-anesthesia Checklist: Patient identified, Emergency Drugs available, Suction available, Patient being monitored and Timeout performed Patient Re-evaluated:Patient Re-evaluated prior to inductionOxygen Delivery Method: Circle system utilized Preoxygenation: Pre-oxygenation with 100% oxygen Intubation Type: IV induction Ventilation: Mask ventilation without difficulty Laryngoscope Size: Mac and 4 Grade View: Grade I Tube type: Oral Tube size: 7.5 mm Number of attempts: 1 Airway Equipment and Method: Stylet Placement Confirmation: ETT inserted through vocal cords under direct vision,  breath sounds checked- equal and bilateral and positive ETCO2 Secured at: 22 cm Tube secured with: Tape Dental Injury: Teeth and Oropharynx as per pre-operative assessment

## 2015-09-07 NOTE — Anesthesia Postprocedure Evaluation (Signed)
Anesthesia Post Note  Patient: Curtis HuxleyJames Reyez  Procedure(s) Performed: Procedure(s) (LRB): RIGHT Lumbar three-four, lumbar four-five, lumbar five -sacral one  FORAMINOTOMY (Right)  Patient location during evaluation: PACU Anesthesia Type: General Level of consciousness: awake and alert, oriented and patient cooperative Pain management: pain level controlled Vital Signs Assessment: post-procedure vital signs reviewed and stable Respiratory status: spontaneous breathing, nonlabored ventilation, respiratory function stable and patient connected to nasal cannula oxygen Cardiovascular status: blood pressure returned to baseline and stable Postop Assessment: no signs of nausea or vomiting Anesthetic complications: no    Last Vitals:  Filed Vitals:   09/07/15 1315 09/07/15 1358  BP: 103/61 116/65  Pulse: 50 50  Temp: 36.7 C 36.4 C  Resp: 19 13    Last Pain:  Filed Vitals:   09/07/15 1403  PainSc: 6                  Alta Shober,E. Lular Letson

## 2015-09-07 NOTE — Transfer of Care (Signed)
Immediate Anesthesia Transfer of Care Note  Patient: Curtis HuxleyJames Bumpass  Procedure(s) Performed: Procedure(s): RIGHT Lumbar three-four, lumbar four-five, lumbar five -sacral one  FORAMINOTOMY (Right)  Patient Location: PACU  Anesthesia Type:General  Level of Consciousness: awake, alert , oriented and patient cooperative  Airway & Oxygen Therapy: Patient Spontanous Breathing and Patient connected to nasal cannula oxygen  Post-op Assessment: Report given to RN, Post -op Vital signs reviewed and stable and Patient moving all extremities  Post vital signs: Reviewed and stable  Last Vitals:  Filed Vitals:   09/07/15 0719 09/07/15 0721  BP: 116/73   Pulse:  64  Temp: 37 C   Resp: 20     Complications: No apparent anesthesia complications

## 2015-09-08 ENCOUNTER — Encounter (HOSPITAL_COMMUNITY): Payer: Self-pay | Admitting: Neurosurgery

## 2015-09-08 MED ORDER — OXYCODONE HCL 5 MG PO TABS
10.0000 mg | ORAL_TABLET | ORAL | Status: DC | PRN
  Administered 2015-09-08 – 2015-09-09 (×6): 10 mg via ORAL
  Filled 2015-09-08 (×6): qty 2

## 2015-09-08 NOTE — Progress Notes (Signed)
2 mg morphine wasted with Soutchay, RN.

## 2015-09-08 NOTE — Progress Notes (Signed)
Occupational Therapy Evaluation Patient Details Name: Curtis Benson MRN: [id number] DOB: Feb 20, 1966 Today's Date: 09/08/2015    History of Present Illness s/p lumbar laminectomy/decompression microdiscectomy 3 levels   Clinical Impression   Pt admitted with the above diagnoses and presents with below problem list. Pt will benefit from continued acute OT to address the below listed deficits and maximize independence with BADLs prior to d/c home. PTA pt was independent with ADLs. Pt is currently supervision to min guard with ADLs. Session details below. OT to continue to follow acutely.     Follow Up Recommendations  Supervision - Intermittent;No OT follow up    Equipment Recommendations  3 in 1 bedside comode    Recommendations for Other Services PT consult     Precautions / Restrictions Precautions Precautions: Back Restrictions Weight Bearing Restrictions: No      Mobility Bed Mobility Overal bed mobility: Modified Independent             General bed mobility comments: pt with good recall of logroll technique  Transfers Overall transfer level: Needs assistance Equipment used: None Transfers: Sit to/from Stand Sit to Stand: Supervision         General transfer comment: from EOB, regular height toilet, and recliner. Initially used rw but did not appear to be needed.     Balance Overall balance assessment: Needs assistance Sitting-balance support: Feet supported Sitting balance-Leahy Scale: Good Sitting balance - Comments: able to access feet in sitting position   Standing balance support: No upper extremity supported Standing balance-Leahy Scale: Good                              ADL Overall ADL's : Needs assistance/impaired Eating/Feeding: Set up;Sitting   Grooming: Standing;Set up;Cueing for compensatory techniques   Upper Body Bathing: Set up;Cueing for compensatory techniques;Sitting   Lower Body Bathing: Min guard;Sit to/from  stand;Cueing for compensatory techniques   Upper Body Dressing : Set up;Sitting;Cueing for compensatory techniques   Lower Body Dressing: Min guard;Sit to/from stand   Toilet Transfer: Supervision/safety;Ambulation;Regular Toilet;Grab bars   Toileting- Clothing Manipulation and Hygiene: Set up;Sit to/from stand   Tub/ Shower Transfer: Walk-in shower;Supervision/safety;Ambulation;Grab bars   Functional mobility during ADLs: Supervision/safety General ADL Comments: Pt completed in-room functional mobility with no AD utilited. ADL edcuation provided. Completed toilet transfer and bed mobilty as detailed above and below. Fiance present for session.     Vision     Perception     Praxis      Pertinent Vitals/Pain Pain Assessment: 0-10 Pain Score: 5  Pain Location: back Pain Descriptors / Indicators: Aching;Sore Pain Intervention(s): Limited activity within patient's tolerance;Monitored during session;Repositioned     Hand Dominance Right   Extremity/Trunk Assessment Upper Extremity Assessment Upper Extremity Assessment: Overall WFL for tasks assessed   Lower Extremity Assessment Lower Extremity Assessment: Defer to PT evaluation       Communication Communication Communication: No difficulties   Cognition Arousal/Alertness: Awake/alert Behavior During Therapy: WFL for tasks assessed/performed;Restless Overall Cognitive Status: Within Functional Limits for tasks assessed                     General Comments   Pt stating that he does not want bed/chair alarm on. Pt with no LOB during in-room functional mobility with no AD utilized.          Exercises       Shoulder Instructions      Home Living  Family/patient expects to be discharged to:: Private residence Living Arrangements: Children;Spouse/significant other Available Help at Discharge: Family;Available 24 hours/day Type of Home: Mobile home Home Access: Stairs to enter Entrance Stairs-Number of  Steps: 3 Entrance Stairs-Rails: Right;Left Home Layout: One level     Bathroom Shower/Tub: Tub/shower unit;Walk-in shower   Bathroom Toilet: Standard Bathroom Accessibility: Yes How Accessible: Accessible via walker Home Equipment: Crutches;Cane - single point;Grab bars - tub/shower          Prior Functioning/Environment Level of Independence: Independent        Comments: Pt is a self-employed Journalist, newspaperauto mechanic.     OT Diagnosis: Acute pain   OT Problem List: Impaired balance (sitting and/or standing);Decreased knowledge of use of DME or AE;Decreased knowledge of precautions;Pain   OT Treatment/Interventions: Self-care/ADL training;DME and/or AE instruction;Therapeutic activities;Patient/family education;Balance training    OT Goals(Current goals can be found in the care plan section) Acute Rehab OT Goals Patient Stated Goal: home by tomorrow (09/09/15) OT Goal Formulation: With patient Time For Goal Achievement: 09/15/15 Potential to Achieve Goals: Good ADL Goals Pt Will Perform Grooming: with modified independence;standing Pt Will Perform Lower Body Bathing: with modified independence;sit to/from stand Pt Will Perform Lower Body Dressing: with modified independence;sit to/from stand Pt Will Perform Tub/Shower Transfer: Shower transfer;with modified independence;ambulating;3 in 1  OT Frequency: Min 2X/week   Barriers to D/C:    Pt stating that he feels he needs to be d/c by tomorrow. "[place] minutes away and with the snow coming in. Plus we only have childcare through tomorrow."       Co-evaluation              End of Session Equipment Utilized During Treatment: Back brace Nurse Communication: Other (comment);Mobility status (discussed no chair alarm for pt due to pt request and balanc)  Activity Tolerance: Patient tolerated treatment well Patient left: in chair;with call bell/phone within reach;with family/visitor present   Time: 1113-1130 OT  Time Calculation (min): 17 min Charges:  OT General Charges $OT Visit: 1 Procedure OT Evaluation $OT Eval Low Complexity: 1 Procedure G-Codes:    Curtis GrammesMathews, Curtis Benson 09/08/2015, 11:47 AM

## 2015-09-08 NOTE — Progress Notes (Signed)
PT Cancellation Note  Patient Details Name: Curtis HuxleyJames Filip MRN: [id number] DOB: [birth year]   Cancelled Treatment:    Reason Eval/Treat Not Completed: Medical issues which prohibited therapy. Pt's brace has not arrived.  Will check back to complete eval.  Daneya Hartgrove LUBECK 09/08/2015, 10:34 AM

## 2015-09-08 NOTE — Care Management Note (Signed)
Case Management Note  Patient Details  Name: Lois HuxleyJames Hurta MRN: [id number] Date of Birth: [birth year]  Subjective/Objective:   Patient admitted for a L3-S1 Foraminotomy. He is from home with family.                Action/Plan: Awaiting PT/OT recommendations. CM will continue to follow for discharge needs.  Expected Discharge Date:                  Expected Discharge Plan:     In-House Referral:     Discharge planning Services     Post Acute Care Choice:    Choice offered to:     DME Arranged:    DME Agency:     HH Arranged:    HH Agency:     Status of Service:  In process, will continue to follow  Medicare Important Message Given:    Date Medicare IM Given:    Medicare IM give by:    Date Additional Medicare IM Given:    Additional Medicare Important Message give by:     If discussed at Long Length of Stay Meetings, dates discussed:    Additional Comments:  Kermit BaloKelli F Lamona Eimer, RN 09/08/2015, 10:17 AM

## 2015-09-08 NOTE — Op Note (Signed)
NAME:  Curtis Benson, Curtis Benson                 ACCOUNT NO.:  [id number]  MEDICAL RECORD NO.:  [id number]  LOCATION:  [facility]                         FACILITY:  [facility]  PHYSICIAN:  Hilda LiasErnesto Pammie Chirino, M.D.   DATE OF BIRTH:  August 19, 1966  DATE OF PROCEDURE:  09/07/2015 DATE OF DISCHARGE:                              OPERATIVE REPORT   PREOPERATIVE DIAGNOSES:  Lumbar spondylosis, with a chronic radiculopathy, mostly in the right side.  Spondylolisthesis with stenosis, is status post lumbar laminectomy.  POSTOPERATIVE DIAGNOSES:  Lumbar spondylosis, with a chronic radiculopathy, mostly in the right side.  Spondylolisthesis with stenosis, is status post lumbar laminectomy.  PROCEDURE:  Right L3, L4, L5, and S1 decompression of the nerve root to medial and lateral approach.  Removal of a loose facet of L4, in the right side.  Posterolateral arthrodesis in the right side with a Vitoss autograft and BMP.  Lysis of the adhesion.  Microscope.  SURGEON:  Hilda LiasErnesto Dim Meisinger, M.D.  ASSISTANT:  Dr. Gerlene FeeKritzer.  CLINICAL HISTORY:  Mr. Curtis Benson is a gentleman, who is a heavy smoker, who underwent lumbar laminectomies in the past.  Now, he came to my office with worsening of the pain mostly to the right side.  The x-rays show multiple level spondylosis bilaterally, by x-ray worse in the left side, but he is more symptomatic in the left side.  He has no symptoms in the left side.  Surgery was advised.  He knew the risk of the surgery including the possibility of further surgery, CSF leak, infection, no improvement whatsoever.  We had talked about this surgery about the knee and the relationship between lumbar degenerative disc disease and cigarette.  DESCRIPTION OF PROCEDURE:  The patient was taken to the OR, and after intubation, he was positioned in a prone manner.  The back was cleaned with dura prep and drapes were applied.  Midline incision following the previous one from L3-L5 and S1 was made.  Immediately, we found  quite a bit of scar tissue.  It was difficult to go into the midline.  So my approach was it was laterally and I was able to visualize the transverse process of L3, L4, and L5.  From then on we started __________ in the medial aspect of the facet  at the level of L3-4.  The patient had quite a bit of tissue and I was able to decompress the L3 and L4 nerve roots.  At the level of L4- 5, the facet was loose.  That might explain why he has some spondylolisthesis.  That area was also clean, lysis was accomplished and at L5-S1 decompression of the S1 the hemilaminectomy was achieved.  From then on, I went laterally and removed the periosteum of the lateral aspect of the facet of L3-4, L4-5, and L5-S1, as well as the periosteum of the transverse process.  A mix of BMP and autograft and _vitoss_________ were used for arthrodesis.  The area was irrigated.  Valsalva maneuver was negative. From then on, the wound was closed with Vicryl and staples.  The patient is going to have a brace.  He would be and PCA morphine and we are going to get hold of  the rehabilitation service for help.          ______________________________ Hilda Lias, M.D.     EB/MEDQ  D:  09/07/2015  T:  09/07/2015  Job:  [id number]

## 2015-09-08 NOTE — Evaluation (Signed)
Physical Therapy Evaluation Patient Details Name: Curtis HuxleyJames Forde MRN: [id number] DOB: 02-Oct-1965 Today's Date: 09/08/2015   History of Present Illness  s/p lumbar laminectomy/decompression microdiscectomy 3 levels  Clinical Impression  Pt admitted with above diagnosis. Pt currently with functional limitations due to the deficits listed below (see PT Problem List). Pt will benefit from skilled PT to increase their independence and safety with mobility to allow discharge to the venue listed below.  Pt did well with PT eval.  Will follow up to assess recall of precautions with mobility tomorrow.  Do not feel he needs any further PT after d/c.     Follow Up Recommendations No PT follow up    Equipment Recommendations  None recommended by PT    Recommendations for Other Services       Precautions / Restrictions Precautions Precautions: Back Required Braces or Orthoses: Spinal Brace Spinal Brace: Lumbar corset Restrictions Weight Bearing Restrictions: No      Mobility  Bed Mobility Overal bed mobility: Modified Independent             General bed mobility comments: sitting up in recliner upon arrival  Transfers Overall transfer level: Needs assistance Equipment used: None Transfers: Sit to/from Stand Sit to Stand: Supervision         General transfer comment: Able to stand without AD.  Cues for not twisting when speaking to family member upon standing.  Ambulation/Gait Ambulation/Gait assistance: Supervision;Min guard Ambulation Distance (Feet): 200 Feet Assistive device: None Gait Pattern/deviations: Step-through pattern;Wide base of support     General Gait Details: decreased toe off with slightly ER LE with quick cadence. Pt appeared winded with activity.  Stairs Stairs: Yes Stairs assistance: Min guard Stair Management: One rail Left;Alternating pattern Number of Stairs: 10 General stair comments: Able to negotiate stairs with min/guard with step through  pattern  Wheelchair Mobility    Modified Rankin (Stroke Patients Only)       Balance Overall balance assessment: Needs assistance Sitting-balance support: Feet supported Sitting balance-Leahy Scale: Good Sitting balance - Comments: able to access feet in sitting position   Standing balance support: No upper extremity supported Standing balance-Leahy Scale: Good                               Pertinent Vitals/Pain Pain Assessment: 0-10 Pain Score: 6  Pain Location: back Pain Descriptors / Indicators: Aching;Sore Pain Intervention(s): Monitored during session;Patient requesting pain meds-RN notified;Repositioned    Home Living Family/patient expects to be discharged to:: Private residence Living Arrangements: Children;Spouse/significant other Available Help at Discharge: Family;Available 24 hours/day Type of Home: Mobile home Home Access: Stairs to enter Entrance Stairs-Rails: Doctor, general practiceight;Left Entrance Stairs-Number of Steps: 3 Home Layout: One level Home Equipment: Crutches;Cane - single point;Grab bars - tub/shower      Prior Function Level of Independence: Independent         Comments: Pt is a self-employed Journalist, newspaperauto mechanic.      Hand Dominance   Dominant Hand: Right    Extremity/Trunk Assessment   Upper Extremity Assessment: Defer to OT evaluation           Lower Extremity Assessment: Overall WFL for tasks assessed         Communication   Communication: No difficulties  Cognition Arousal/Alertness: Awake/alert Behavior During Therapy: WFL for tasks assessed/performed;Restless Overall Cognitive Status: Within Functional Limits for tasks assessed  General Comments General comments (skin integrity, edema, etc.): Pt with good recall of back precautions from earlier OT session. Pt refusing bed/chair alarm.    Exercises        Assessment/Plan    PT Assessment Patient needs continued PT services  PT  Diagnosis Difficulty walking;Acute pain   PT Problem List Decreased strength;Decreased balance;Decreased mobility  PT Treatment Interventions Gait training;Stair training;Functional mobility training;Therapeutic activities;Therapeutic exercise;Balance training   PT Goals (Current goals can be found in the Care Plan section) Acute Rehab PT Goals Patient Stated Goal: home by tomorrow (09/09/15) PT Goal Formulation: With patient Time For Goal Achievement: 09/15/15 Potential to Achieve Goals: Good    Frequency Min 5X/week   Barriers to discharge        Co-evaluation               End of Session Equipment Utilized During Treatment: Gait belt;Back brace Activity Tolerance: Patient tolerated treatment well Patient left: in chair;with call bell/phone within reach;with family/visitor present Nurse Communication: Patient requests pain meds         Time: 1159-1210 PT Time Calculation (min) (ACUTE ONLY): 11 min   Charges:   PT Evaluation $PT Eval Low Complexity: 1 Procedure     PT G Codes:        Keatyn Luck LUBECK 09/08/2015, 12:21 PM

## 2015-09-08 NOTE — Progress Notes (Signed)
Orthopedic Tech Progress Note Patient Details:  Curtis Benson 03/18/1966 [id number]  Patient ID: Curtis Benson, male   DOB: 03/18/1966, 50 y.o.   MRN: [id number]   [name] 09/08/2015, 9:15 AMCalled [employer] for [place] lumbar brace.

## 2015-09-08 NOTE — Progress Notes (Signed)
Patient ID: Curtis Benson, male   DOB: [birth year], 50 y.o.   MRN: [id number] Doing great, no pain as preop. No weakness. Plans to go home in am to take care of his kids

## 2015-09-09 NOTE — Progress Notes (Signed)
Occupational Therapy Treatment Patient Details Name: Curtis Benson MRN: [id number] DOB: 10-Apr-1966 Today's Date: 09/09/2015    History of present illness s/p lumbar laminectomy/decompression microdiscectomy 3 levels   OT comments  Pt making great progress towards goals.  Pt ambulated into bathroom and completed toilet transfer and toileting without assist.  Pt reports dressing independently this AM with exception of his fiance donning his socks and shoes.  Pt able to recall 3/3 back precautions.  Provided education on DME or AE with pt reporting that he has all equipment from admission in March.  Pt anxious to return home prior to impending snow.    Follow Up Recommendations  Supervision - Intermittent;No OT follow up    Equipment Recommendations  3 in 1 bedside comode    Recommendations for Other Services      Precautions / Restrictions Precautions Precautions: Back Precaution Comments: able to recall 3/3 back precautions Required Braces or Orthoses: Spinal Brace Spinal Brace: Lumbar corset       Mobility  Transfers Overall transfer level: Modified independent Equipment used: None Transfers: Sit to/from Stand Sit to Stand: Modified independent (Device/Increase time)                  ADL Overall ADL's : Needs assistance/impaired     Grooming: Independent           Upper Body Dressing : Independent   Lower Body Dressing: Minimal assistance Lower Body Dressing Details (indicate cue type and reason): Pt's reports fiance assisted with socks and shoes, and plans to have her perform those tasks Toilet Transfer: Ambulation;Regular Toilet;Grab bars;Modified Independent   Toileting- Clothing Manipulation and Hygiene: Modified independent;Sit to/from stand   Tub/ Shower Transfer: Walk-in shower;Grab bars;Modified independent;Ambulation     General ADL Comments: Pt completed in-room functional mobility with no AD utilized.  Pt reports completing dressing tasks this AM  independently, with fiance assisting with socks and shoes.  Pt completed toilet transfer and toileting tasks without assist and simulated walk-in shower transfer.                Cognition   Behavior During Therapy: [name] for tasks assessed/performed;Restless Overall Cognitive Status: Within Functional Limits for tasks assessed                                    Pertinent Vitals/ Pain       Pain Assessment: 0-10 Pain Score: 7  Pain Location: back Pain Descriptors / Indicators: Aching;Operative site guarding;Sore Pain Intervention(s): Monitored during session;Repositioned;Patient requesting pain meds-RN notified         Frequency       Progress Toward Goals  OT Goals(current goals can now be found in the care plan section)  Progress towards OT goals: Progressing toward goals;Goals met/education completed, patient discharged from [place] Discharge plan remains appropriate;All goals met and education completed, patient discharged from [place] During Treatment: Back brace   Activity Tolerance Patient tolerated treatment well   Patient Left in chair;with call bell/phone within reach;with family/visitor present   Nurse Communication Patient requests pain meds        Time: 9767-3419 OT Time Calculation (min): 9 min  Charges: OT General Charges $OT Visit: 1 Procedure OT Treatments $Self Care/Home Management : 8-22 mins  Simonne Come, [phone] 09/09/2015, 9:14 AM

## 2015-09-09 NOTE — Progress Notes (Signed)
Discharge orders received. Pt educated on discharge instructions and spine precautions. Pt given discharge packet. IV removed. Pt's home equipment delivered to room.Pt taken downstairs by staff via wheelchair.

## 2015-09-09 NOTE — Discharge Summary (Signed)
Physician Discharge Summary  Patient ID: Lois HuxleyJames Gassman MRN: [id number] DOB/AGE: [birth year] 49 y.o.  Admit date: 09/07/2015 Discharge date: 09/09/2015  Admission Diagnoses:lumbas stenosis  Discharge Diagnoses:  Active Problems:   Lumbar spondylosis   Discharged Condition: no pain  Hospital Course: surgery  Consults: none  Significant Diagnostic Studies: myelogram  Treatments:right lumbar decompression and fusion from l3 to s1  Discharge Exam: Blood pressure 122/63, pulse 80, temperature 99 F (37.2 C), temperature source Oral, resp. rate 20, height 5\' 10"  (1.778 m), weight 64.9 kg (143 lb 1.3 oz), SpO2 95 %. Wound dry. Some swelling, no pain , no weakness  Disposition: i wanted to keep him for at least 3 more days but he needs to go home to take care of his 2 little kids. Advised about do and donts     Medication List    ASK your doctor about these medications        Oxycodone HCl 10 MG Tabs  Take 10 mg by mouth 3 (three) times daily as needed.         Signed: Karn CassisBOTERO,Jaiquan Temme M 09/09/2015, 9:59 AM

## 2015-09-09 NOTE — Care Management Note (Signed)
Case Management Note  Patient Details  Name: Curtis Benson MRN: [id number] Date of Birth: 1966/05/04  Subjective/Objective:                    Action/Plan: Patient being discharged home with family today. Patient with order for 3 in 1. CM met with the patient and he feels he could use this at home. CM notified Jermaine with [employer] and he is going to deliver the equipment to the room. Charge RN updated.   Expected Discharge Date:                  Expected Discharge Plan:  Home/Self Care  In-House Referral:     Discharge planning Services  CM Consult  Post Acute Care Choice:  Durable Medical Equipment Choice offered to:  Patient  DME Arranged:  3-N-1 DME Agency:  [place]:    [place]:     Status of Service:  Completed, signed off  Medicare Important Message Given:    Date Medicare IM Given:    Medicare IM give by:    Date Additional Medicare IM Given:    Additional Medicare Important Message give by:     If discussed at [place] Meetings, dates discussed:    Additional Comments:  Pollie Friar, RN 09/09/2015, 10:30 AM

## 2015-09-09 NOTE — Progress Notes (Signed)
Physical Therapy Treatment Patient Details Name: Curtis HuxleyJames Benson MRN: [id number] DOB: 01-Jul-1966 Today's Date: 09/09/2015    History of Present Illness s/p lumbar laminectomy/decompression microdiscectomy 3 levels    PT Comments    Pt progressing towards physical therapy goals. Was able to progress gait training and improve ambulation distance, and also negotiated 10 stairs with increased independence this session. Pt was able to recall 3/3 back precautions with no cueing and pt reports no further questions. Discharge is anticipated today however therapy will continue to follow until d/c.   Follow Up Recommendations  No PT follow up     Equipment Recommendations  3in1 (PT)    Recommendations for Other Services       Precautions / Restrictions Precautions Precautions: Back Precaution Booklet Issued: Yes (comment) Precaution Comments: able to recall 3/3 back precautions without cues Required Braces or Orthoses: Spinal Brace Spinal Brace: Lumbar corset;Applied in sitting position Restrictions Weight Bearing Restrictions: No    Mobility  Bed Mobility               General bed mobility comments: Pt up in recliner upon PT arrival.   Transfers Overall transfer level: Modified independent Equipment used: None Transfers: Sit to/from Stand Sit to Stand: Modified independent (Device/Increase time)         General transfer comment: Able to stand without AD.  Pt demonstrated proper hand placement on seated surface for safety.   Ambulation/Gait Ambulation/Gait assistance: Modified independent (Device/Increase time) Ambulation Distance (Feet): 300 Feet Assistive device: None Gait Pattern/deviations: WFL(Within Functional Limits) Gait velocity: Decreased Gait velocity interpretation: Below normal speed for age/gender General Gait Details: Slightly antalgic due to LE symptoms prior to surgery.    Stairs Stairs: Yes Stairs assistance: Supervision Stair Management: One rail  Right;Alternating pattern;Forwards Number of Stairs: 10 General stair comments: No assist required. Supervision for safety.   Wheelchair Mobility    Modified Rankin (Stroke Patients Only)       Balance Overall balance assessment: No apparent balance deficits (not formally assessed)                                  Cognition Arousal/Alertness: Awake/alert Behavior During Therapy: WFL for tasks assessed/performed;Restless Overall Cognitive Status: Within Functional Limits for tasks assessed                      Exercises      General Comments General comments (skin integrity, edema, etc.): Noted that pt's family member was smoking in bathroom upon PT arrival. Nursing notified.       Pertinent Vitals/Pain Pain Assessment: Faces Pain Score: 7  Faces Pain Scale: Hurts a little bit Pain Location: Incision Pain Descriptors / Indicators: Operative site guarding;Discomfort Pain Intervention(s): Monitored during session;Limited activity within patient's tolerance;Repositioned    Home Living                      Prior Function            PT Goals (current goals can now be found in the care plan section) Acute Rehab PT Goals Patient Stated Goal: Home today PT Goal Formulation: With patient Time For Goal Achievement: 09/15/15 Potential to Achieve Goals: Good Progress towards PT goals: Progressing toward goals    Frequency  Min 5X/week    PT Plan Current plan remains appropriate    Co-evaluation  End of Session Equipment Utilized During Treatment: Back brace Activity Tolerance: Patient tolerated treatment well Patient left: in chair;with call bell/phone within reach;with family/visitor present     Time: 1610-9604 PT Time Calculation (min) (ACUTE ONLY): 10 min  Charges:  $Gait Training: 8-22 mins                    G Codes:      Conni Slipper 09/24/2015, 10:45 AM   Conni Slipper, PT, DPT Acute Rehabilitation  Services Pager: [phone]

## 2016-06-24 IMAGING — CR DG LUMBAR SPINE 2-3V
2 series · 2 of 2 positions shown · non-contrast
Comparison: 06/15/2015

CLINICAL DATA: Right L3-4 to L5-S1 foraminotomy.

EXAM:
LUMBAR SPINE - 2-3 VIEW

[lateral (1 of 2)]
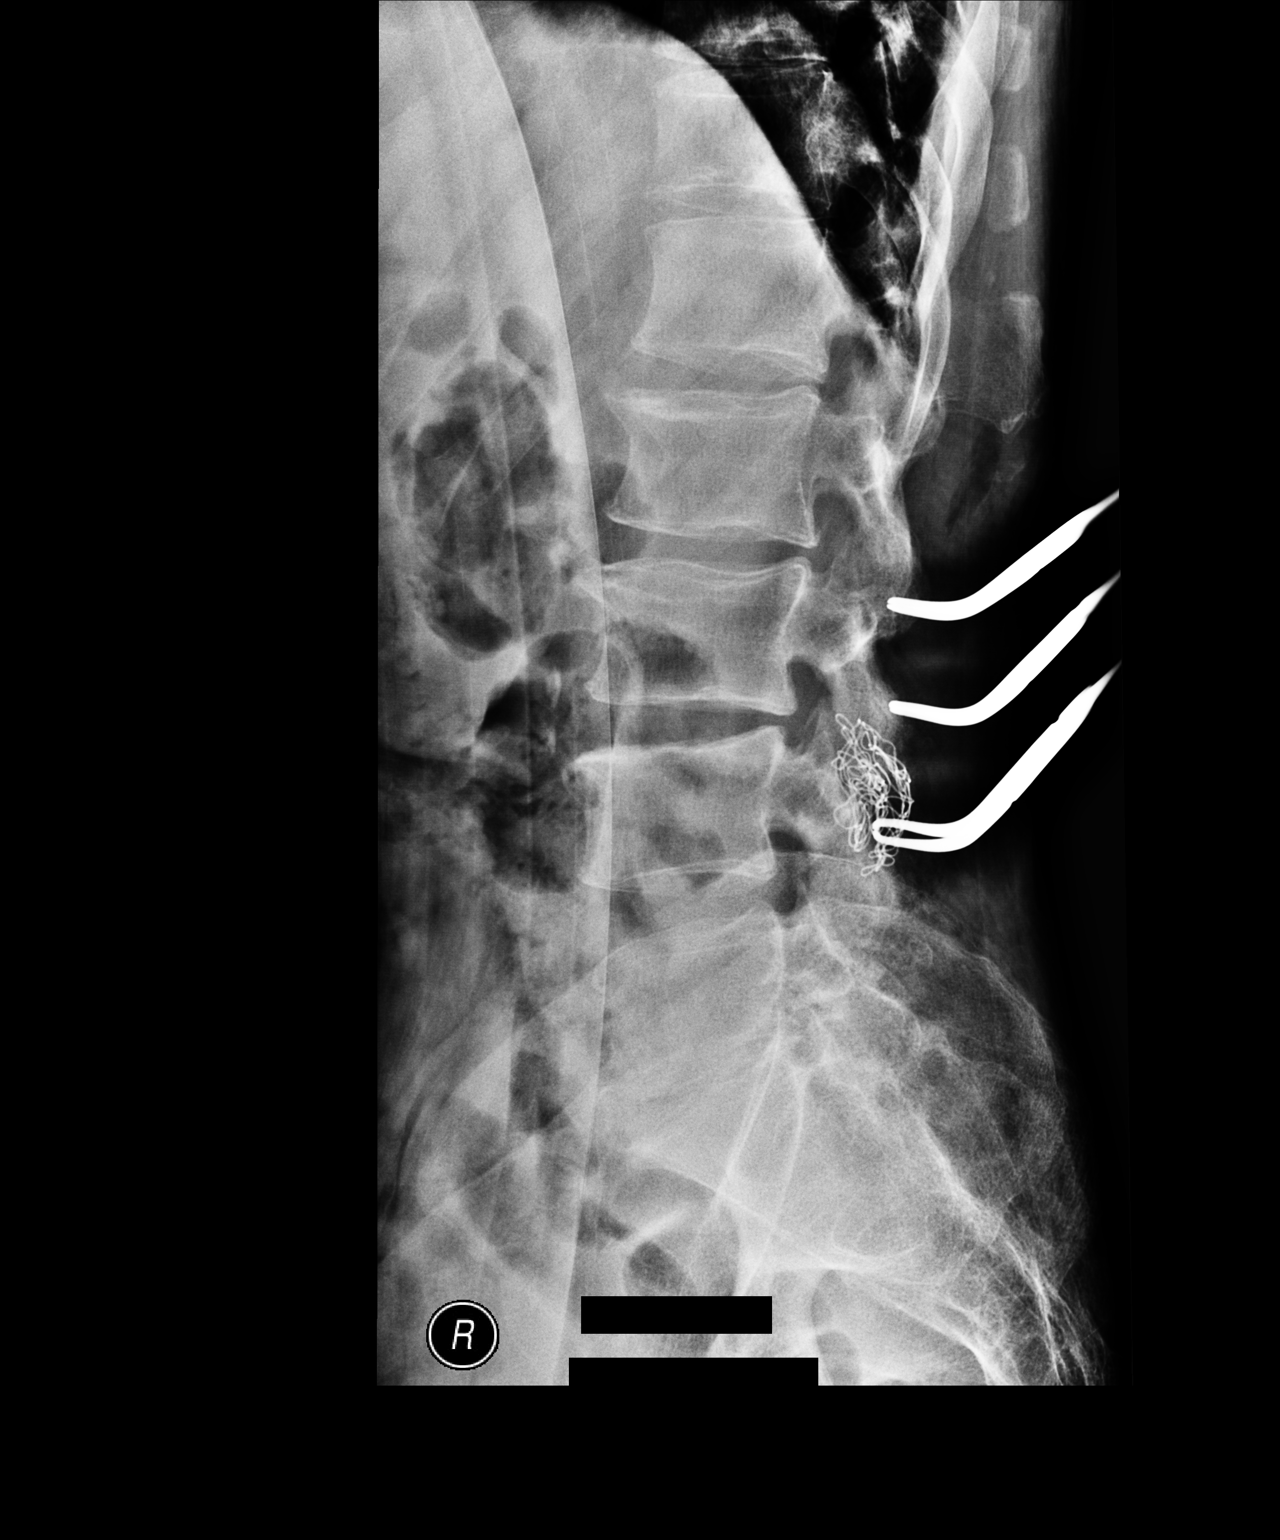

[lateral (2 of 2)]
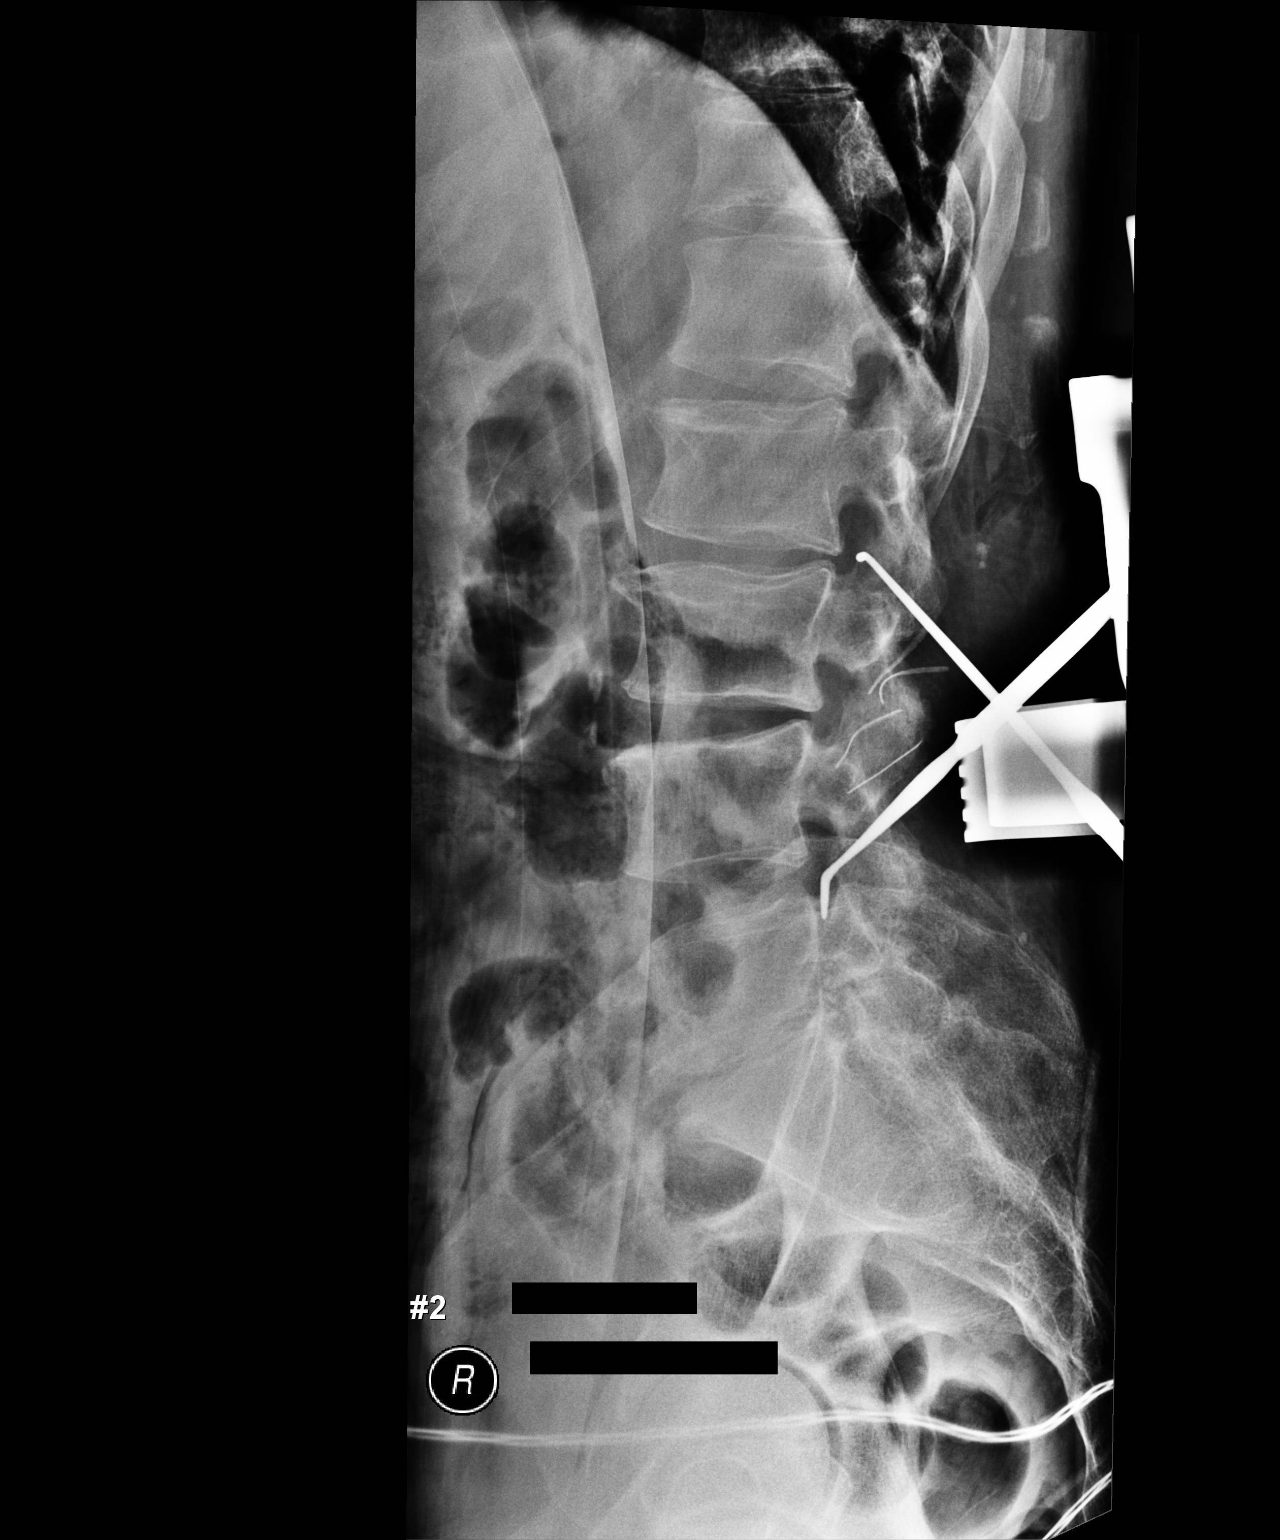

[2 of 2 positions shown; findings below may reference images not displayed]

FINDINGS: Vertebral body alignment and heights are within normal. There is
mild spondylosis throughout the lumbar spine to include facet
arthropathy. There is disc space narrowing at the L5-S1 level.
Surgical instruments are present over the spinal canal from the L2-3
level to the L5-S1 level. Recommend correlation with findings at the
time of the procedure.
IMPRESSION: Surgery instruments over the level the spinal canal from the L2-3
level to the L5-S1 level.

Mild spondylosis throughout the lumbar spine with disc space
narrowing at the L5-S1 level.

## 2018-02-25 ENCOUNTER — Other Ambulatory Visit: Payer: Self-pay | Admitting: Neurosurgery

## 2018-03-12 ENCOUNTER — Encounter (HOSPITAL_COMMUNITY): Payer: Self-pay

## 2018-03-12 NOTE — Pre-Procedure Instructions (Signed)
Curtis HuxleyJames Sherman  03/12/2018      [facility] [address] - [place] - [place], [place] - [address] [place] [address] Phone: [phone] Fax: [phone]    Your procedure is scheduled on Wednesday July 17.  [facility] Admitting at 6:30 A.M.  Call this number if you have problems the morning of surgery:  [phone]   Remember:  Do not eat or drink after midnight.    Take these medicines the morning of surgery with A SIP OF WATER: NONE  7 days prior to surgery STOP taking any Aspirin(unless otherwise instructed by your surgeon), Aleve, Naproxen, Ibuprofen, Motrin, Advil, Goody's, BC's, all herbal medications, fish oil, and all vitamins     Do not wear jewelry  Do not wear lotions, powders, or colognes, or deodorant.  Do not shave 48 hours prior to surgery.  Men may shave face and neck.  Do not bring valuables to the hospital.  [facility] is not responsible for any belongings or valuables.  Contacts, dentures or bridgework may not be worn into surgery.  Leave your suitcase in the car.  After surgery it may be brought to your room.  For patients admitted to the hospital, discharge time will be determined by your treatment team.  Patients discharged the day of surgery will not be allowed to drive home.   Special instructions:    [facility]- Preparing For Surgery  Before surgery, you can play an important role. Because skin is not sterile, your skin needs to be as free of germs as possible. You can reduce the number of germs on your skin by washing with CHG (chlorahexidine gluconate) Soap before surgery.  CHG is an antiseptic cleaner which kills germs and bonds with the skin to continue killing germs even after washing.    Oral Hygiene is also important to reduce your risk of infection.  Remember - BRUSH YOUR TEETH THE MORNING OF SURGERY WITH YOUR REGULAR TOOTHPASTE  Please do not use if you have an allergy to CHG or antibacterial  soaps. If your skin becomes reddened/irritated stop using the CHG.  Do not shave (including legs and underarms) for at least 48 hours prior to first CHG shower. It is OK to shave your face.  Please follow these instructions carefully.   1. Shower the NIGHT BEFORE SURGERY and the MORNING OF SURGERY with CHG.   2. If you chose to wash your hair, wash your hair first as usual with your normal shampoo.  3. After you shampoo, rinse your hair and body thoroughly to remove the shampoo.  4. Use CHG as you would any other liquid soap. You can apply CHG directly to the skin and wash gently with a scrungie or a clean washcloth.   5. Apply the CHG Soap to your body ONLY FROM THE NECK DOWN.  Do not use on open wounds or open sores. Avoid contact with your eyes, ears, mouth and genitals (private parts). Wash Face and genitals (private parts)  with your normal soap.  6. Wash thoroughly, paying special attention to the area where your surgery will be performed.  7. Thoroughly rinse your body with warm water from the neck down.  8. DO NOT shower/wash with your normal soap after using and rinsing off the CHG Soap.  9. Pat yourself dry with a CLEAN TOWEL.  10. Wear CLEAN PAJAMAS to bed the night before surgery, wear comfortable clothes the morning  of surgery  11. Place CLEAN SHEETS on your bed the night of your first shower and DO NOT SLEEP WITH PETS.    Day of Surgery:  Do not apply any deodorants/lotions.  Please wear clean clothes to the hospital/surgery center.   Remember to brush your teeth WITH YOUR REGULAR TOOTHPASTE.    Please read over the following fact sheets that you were given. Coughing and Deep Breathing, MRSA Information and Surgical Site Infection Prevention

## 2018-03-13 ENCOUNTER — Encounter (HOSPITAL_COMMUNITY)
Admission: RE | Admit: 2018-03-13 | Discharge: 2018-03-13 | Disposition: A | Discharge status: Home / Self Care | Payer: Medicaid Other | Source: Ambulatory Visit | Attending: Neurosurgery | Admitting: Neurosurgery

## 2018-03-13 ENCOUNTER — Other Ambulatory Visit: Payer: Self-pay

## 2018-03-13 ENCOUNTER — Encounter (HOSPITAL_COMMUNITY): Payer: Self-pay

## 2018-03-13 DIAGNOSIS — R001 Bradycardia, unspecified: Secondary | ICD-10-CM | POA: Insufficient documentation

## 2018-03-13 DIAGNOSIS — Z01810 Encounter for preprocedural cardiovascular examination: Secondary | ICD-10-CM | POA: Insufficient documentation

## 2018-03-13 DIAGNOSIS — M48062 Spinal stenosis, lumbar region with neurogenic claudication: Secondary | ICD-10-CM | POA: Insufficient documentation

## 2018-03-13 DIAGNOSIS — Z01818 Encounter for other preprocedural examination: Secondary | ICD-10-CM | POA: Insufficient documentation

## 2018-03-13 HISTORY — DX: Essential (primary) hypertension: I10

## 2018-03-13 LAB — COMPREHENSIVE METABOLIC PANEL
ALK PHOS: 78 U/L (ref 38–126)
ALT: 17 U/L (ref 0–44)
ANION GAP: 8 (ref 5–15)
AST: 18 U/L (ref 15–41)
Albumin: 3.5 g/dL (ref 3.5–5.0)
BILIRUBIN TOTAL: 0.6 mg/dL (ref 0.3–1.2)
BUN: 8 mg/dL (ref 6–20)
CALCIUM: 9.2 mg/dL (ref 8.9–10.3)
CO2: 27 mmol/L (ref 22–32)
CREATININE: 0.86 mg/dL (ref 0.61–1.24)
Chloride: 107 mmol/L (ref 98–111)
GFR calc Af Amer: >60 mL/min (ref >60)
GFR calc non Af Amer: >60 mL/min (ref >60)
Glucose, Bld: 89 mg/dL (ref 70–99)
Potassium: 3.9 mmol/L (ref 3.5–5.1)
Sodium: 142 mmol/L (ref 135–145)
TOTAL PROTEIN: 6.5 g/dL (ref 6.5–8.1)

## 2018-03-13 LAB — CBC
HCT: 47.8 % (ref 39.0–52.0)
HEMOGLOBIN: 14.7 g/dL (ref 13.0–17.0)
MCH: 27.1 pg (ref 26.0–34.0)
MCHC: 30.8 g/dL (ref 30.0–36.0)
MCV: 88.2 fL (ref 78.0–100.0)
PLATELETS: 300 10*3/uL (ref 150–400)
RBC: 5.42 MIL/uL (ref 4.22–5.81)
RDW: 14.2 % (ref 11.5–15.5)
WBC: 8.2 10*3/uL (ref 4.0–10.5)

## 2018-03-13 LAB — ABO/RH: ABO/RH(D): O NEG

## 2018-03-13 LAB — TYPE AND SCREEN
ABO/RH(D): O NEG
ANTIBODY SCREEN: NEGATIVE

## 2018-03-13 LAB — SURGICAL PCR SCREEN
MRSA, PCR: NEGATIVE
Staphylococcus aureus: NEGATIVE

## 2018-03-13 NOTE — Progress Notes (Signed)
PCP: Dr. Gaye AlkenAmy Moon @ [facility] in [place]

## 2018-03-18 NOTE — Anesthesia Preprocedure Evaluation (Addendum)
Anesthesia Evaluation  Patient identified by MRN, date of birth, ID band Patient awake    Reviewed: Allergy & Precautions, NPO status , Patient's Chart, lab work & pertinent test results  History of Anesthesia Complications Negative for: history of anesthetic complications  Airway Mallampati: I  TM Distance: >3 FB Neck ROM: Full    Dental  (+) Edentulous Upper, Edentulous Lower   Pulmonary COPD, Current Smoker,    breath sounds clear to auscultation       Cardiovascular hypertension (does not take medications), (-) angina Rhythm:Regular Rate:Normal     Neuro/Psych Chronic back pain    GI/Hepatic negative GI ROS, (+)     substance abuse  marijuana use,   Endo/Other  negative endocrine ROS  Renal/GU negative Renal ROS     Musculoskeletal  (+) Arthritis ,   Abdominal   Peds  Hematology negative hematology ROS (+)   Anesthesia Other Findings   Reproductive/Obstetrics                            Anesthesia Physical Anesthesia Plan  ASA: III  Anesthesia Plan: General   Post-op Pain Management:    Induction: Intravenous  PONV Risk Score and Plan: 2 and Ondansetron and Dexamethasone  Airway Management Planned: Oral ETT  Additional Equipment:   Intra-op Plan:   Post-operative Plan: Extubation in OR  Informed Consent: I have reviewed the patients History and Physical, chart, labs and discussed the procedure including the risks, benefits and alternatives for the proposed anesthesia with the patient or authorized representative who has indicated his/her understanding and acceptance.     Plan Discussed with: CRNA and Surgeon  Anesthesia Plan Comments: (Plan routine monitors, GETA)        Anesthesia Quick Evaluation

## 2018-03-19 ENCOUNTER — Inpatient Hospital Stay (HOSPITAL_COMMUNITY): Payer: Medicaid Other | Admitting: Anesthesiology

## 2018-03-19 ENCOUNTER — Inpatient Hospital Stay (HOSPITAL_COMMUNITY)
Admission: RE | Disposition: A | Discharge status: Home / Self Care | Payer: Self-pay | Source: Home / Self Care | Attending: Neurosurgery

## 2018-03-19 ENCOUNTER — Encounter (HOSPITAL_COMMUNITY): Payer: Self-pay | Admitting: Surgery

## 2018-03-19 ENCOUNTER — Inpatient Hospital Stay (HOSPITAL_COMMUNITY): Payer: Medicaid Other

## 2018-03-19 ENCOUNTER — Inpatient Hospital Stay (HOSPITAL_COMMUNITY)
Admission: RE | Admit: 2018-03-19 | Discharge: 2018-03-21 | DRG: 455 | Disposition: A | Discharge status: Home / Self Care | Payer: Medicaid Other | Attending: Neurosurgery | Admitting: Neurosurgery

## 2018-03-19 ENCOUNTER — Other Ambulatory Visit: Payer: Self-pay

## 2018-03-19 DIAGNOSIS — Z833 Family history of diabetes mellitus: Secondary | ICD-10-CM

## 2018-03-19 DIAGNOSIS — J439 Emphysema, unspecified: Secondary | ICD-10-CM | POA: Diagnosis present

## 2018-03-19 DIAGNOSIS — F17210 Nicotine dependence, cigarettes, uncomplicated: Secondary | ICD-10-CM | POA: Diagnosis present

## 2018-03-19 DIAGNOSIS — Z419 Encounter for procedure for purposes other than remedying health state, unspecified: Secondary | ICD-10-CM

## 2018-03-19 DIAGNOSIS — I10 Essential (primary) hypertension: Secondary | ICD-10-CM | POA: Diagnosis present

## 2018-03-19 DIAGNOSIS — Z981 Arthrodesis status: Secondary | ICD-10-CM | POA: Diagnosis not present

## 2018-03-19 DIAGNOSIS — M48062 Spinal stenosis, lumbar region with neurogenic claudication: Principal | ICD-10-CM | POA: Diagnosis present

## 2018-03-19 DIAGNOSIS — Z9181 History of falling: Secondary | ICD-10-CM | POA: Diagnosis not present

## 2018-03-19 DIAGNOSIS — M5136 Other intervertebral disc degeneration, lumbar region: Secondary | ICD-10-CM | POA: Diagnosis present

## 2018-03-19 HISTORY — PX: MAXIMUM ACCESS (MAS)POSTERIOR LUMBAR INTERBODY FUSION (PLIF) 2 LEVEL: SHX6369

## 2018-03-19 LAB — CBC WITH DIFFERENTIAL/PLATELET
ABS IMMATURE GRANULOCYTES: 0.1 10*3/uL (ref 0.0–0.1)
BASOS ABS: 0 10*3/uL (ref 0.0–0.1)
BASOS PCT: 0 %
Eosinophils Absolute: 0 10*3/uL (ref 0.0–0.7)
Eosinophils Relative: 0 %
HCT: 36.5 % — ABNORMAL LOW (ref 39.0–52.0)
HEMOGLOBIN: 11.1 g/dL — AB (ref 13.0–17.0)
IMMATURE GRANULOCYTES: 1 %
LYMPHS PCT: 5 %
Lymphs Abs: 0.8 10*3/uL (ref 0.7–4.0)
MCH: 27.2 pg (ref 26.0–34.0)
MCHC: 30.4 g/dL (ref 30.0–36.0)
MCV: 89.5 fL (ref 78.0–100.0)
Monocytes Absolute: 1 10*3/uL (ref 0.1–1.0)
Monocytes Relative: 6 %
NEUTROS ABS: 14.6 10*3/uL — AB (ref 1.7–7.7)
NEUTROS PCT: 88 %
PLATELETS: 279 10*3/uL (ref 150–400)
RBC: 4.08 MIL/uL — AB (ref 4.22–5.81)
RDW: 14.3 % (ref 11.5–15.5)
WBC: 16.5 10*3/uL — ABNORMAL HIGH (ref 4.0–10.5)

## 2018-03-19 SURGERY — POSTERIOR LUMBAR FUSION 2 LEVEL
Anesthesia: General | Site: Spine Lumbar

## 2018-03-19 MED ORDER — ONDANSETRON HCL 4 MG/2ML IJ SOLN
INTRAMUSCULAR | Status: AC
  Filled 2018-03-19: qty 2

## 2018-03-19 MED ORDER — DOCUSATE SODIUM 100 MG PO CAPS
100.0000 mg | ORAL_CAPSULE | Freq: Two times a day (BID) | ORAL | Status: DC
  Administered 2018-03-19 – 2018-03-21 (×4): 100 mg via ORAL
  Filled 2018-03-19 (×4): qty 1

## 2018-03-19 MED ORDER — DEXAMETHASONE SODIUM PHOSPHATE 10 MG/ML IJ SOLN
INTRAMUSCULAR | Status: AC
  Filled 2018-03-19: qty 1

## 2018-03-19 MED ORDER — MIDAZOLAM HCL 5 MG/5ML IJ SOLN
INTRAMUSCULAR | Status: DC | PRN
  Administered 2018-03-19: 2 mg via INTRAVENOUS

## 2018-03-19 MED ORDER — MENTHOL 3 MG MT LOZG: 1.0000 | LOZENGE | OROMUCOSAL | Status: DC | PRN

## 2018-03-19 MED ORDER — PHENYLEPHRINE HCL 10 MG/ML IJ SOLN
INTRAMUSCULAR | Status: DC | PRN
  Administered 2018-03-19: 20 ug/min via INTRAVENOUS

## 2018-03-19 MED ORDER — SENNOSIDES-DOCUSATE SODIUM 8.6-50 MG PO TABS: 1.0000 | ORAL_TABLET | Freq: Every evening | ORAL | Status: DC | PRN

## 2018-03-19 MED ORDER — MEPERIDINE HCL 50 MG/ML IJ SOLN: 6.2500 mg | INTRAMUSCULAR | Status: DC | PRN

## 2018-03-19 MED ORDER — ACETAMINOPHEN 325 MG PO TABS: 650.0000 mg | ORAL_TABLET | ORAL | Status: DC | PRN

## 2018-03-19 MED ORDER — THROMBIN 20000 UNITS EX SOLR
CUTANEOUS | Status: DC | PRN
  Administered 2018-03-19: 20 mL via TOPICAL

## 2018-03-19 MED ORDER — ONDANSETRON HCL 4 MG PO TABS: 4.0000 mg | ORAL_TABLET | Freq: Four times a day (QID) | ORAL | Status: DC | PRN

## 2018-03-19 MED ORDER — HYDROMORPHONE HCL 1 MG/ML IJ SOLN
0.2500 mg | INTRAMUSCULAR | Status: DC | PRN
  Administered 2018-03-19 (×2): 0.25 mg via INTRAVENOUS

## 2018-03-19 MED ORDER — GABAPENTIN 300 MG PO CAPS
300.0000 mg | ORAL_CAPSULE | Freq: Three times a day (TID) | ORAL | Status: DC
  Administered 2018-03-19 – 2018-03-21 (×6): 300 mg via ORAL
  Filled 2018-03-19 (×6): qty 1

## 2018-03-19 MED ORDER — DIAZEPAM 5 MG PO TABS
ORAL_TABLET | ORAL | Status: AC
  Administered 2018-03-19: 17:00:00
  Filled 2018-03-19: qty 1

## 2018-03-19 MED ORDER — MIDAZOLAM HCL 2 MG/2ML IJ SOLN
INTRAMUSCULAR | Status: AC
  Filled 2018-03-19: qty 2

## 2018-03-19 MED ORDER — CEFAZOLIN SODIUM-DEXTROSE 2-4 GM/100ML-% IV SOLN
INTRAVENOUS | Status: AC
  Filled 2018-03-19: qty 100

## 2018-03-19 MED ORDER — PROMETHAZINE HCL 25 MG/ML IJ SOLN: 6.2500 mg | INTRAMUSCULAR | Status: DC | PRN

## 2018-03-19 MED ORDER — SODIUM CHLORIDE 0.9% FLUSH: 3.0000 mL | INTRAVENOUS | Status: DC | PRN

## 2018-03-19 MED ORDER — KETAMINE HCL 50 MG/5ML IJ SOSY
PREFILLED_SYRINGE | INTRAMUSCULAR | Status: AC
  Filled 2018-03-19: qty 10

## 2018-03-19 MED ORDER — EPHEDRINE SULFATE 50 MG/ML IJ SOLN
INTRAMUSCULAR | Status: DC | PRN
  Administered 2018-03-19: 10 mg via INTRAVENOUS

## 2018-03-19 MED ORDER — BUPIVACAINE HCL (PF) 0.5 % IJ SOLN
INTRAMUSCULAR | Status: DC | PRN
  Administered 2018-03-19: 30 mL

## 2018-03-19 MED ORDER — LIDOCAINE 2% (20 MG/ML) 5 ML SYRINGE
INTRAMUSCULAR | Status: DC | PRN
  Administered 2018-03-19: 20 mg via INTRAVENOUS

## 2018-03-19 MED ORDER — CEFAZOLIN SODIUM-DEXTROSE 2-4 GM/100ML-% IV SOLN: 2.0000 g | INTRAVENOUS | Status: DC

## 2018-03-19 MED ORDER — PROPOFOL 10 MG/ML IV BOLUS
INTRAVENOUS | Status: AC
  Filled 2018-03-19: qty 20

## 2018-03-19 MED ORDER — LIDOCAINE 2% (20 MG/ML) 5 ML SYRINGE
INTRAMUSCULAR | Status: AC
  Filled 2018-03-19: qty 5

## 2018-03-19 MED ORDER — ACETAMINOPHEN 650 MG RE SUPP: 650.0000 mg | RECTAL | Status: DC | PRN

## 2018-03-19 MED ORDER — ZOLPIDEM TARTRATE 5 MG PO TABS
5.0000 mg | ORAL_TABLET | Freq: Every evening | ORAL | Status: DC | PRN
  Administered 2018-03-20: 5 mg via ORAL
  Filled 2018-03-19 (×2): qty 1

## 2018-03-19 MED ORDER — SODIUM CHLORIDE 0.9% FLUSH
3.0000 mL | Freq: Two times a day (BID) | INTRAVENOUS | Status: DC
  Administered 2018-03-19 – 2018-03-21 (×4): 3 mL via INTRAVENOUS

## 2018-03-19 MED ORDER — SUGAMMADEX SODIUM 200 MG/2ML IV SOLN
INTRAVENOUS | Status: AC
  Filled 2018-03-19: qty 2

## 2018-03-19 MED ORDER — MAGNESIUM CITRATE PO SOLN: 1.0000 | Freq: Once | ORAL | Status: DC | PRN

## 2018-03-19 MED ORDER — KETOROLAC TROMETHAMINE 15 MG/ML IJ SOLN
7.5000 mg | Freq: Four times a day (QID) | INTRAMUSCULAR | Status: AC
  Administered 2018-03-19 – 2018-03-20 (×4): 7.5 mg via INTRAVENOUS
  Filled 2018-03-19 (×4): qty 1

## 2018-03-19 MED ORDER — FENTANYL CITRATE (PF) 250 MCG/5ML IJ SOLN
INTRAMUSCULAR | Status: AC
  Filled 2018-03-19: qty 5

## 2018-03-19 MED ORDER — OXYCODONE HCL ER 15 MG PO T12A
15.0000 mg | EXTENDED_RELEASE_TABLET | Freq: Two times a day (BID) | ORAL | Status: DC
  Administered 2018-03-19 – 2018-03-20 (×2): 15 mg via ORAL
  Filled 2018-03-19 (×2): qty 1

## 2018-03-19 MED ORDER — EPHEDRINE SULFATE 50 MG/ML IJ SOLN
INTRAMUSCULAR | Status: AC
  Filled 2018-03-19: qty 1

## 2018-03-19 MED ORDER — LIDOCAINE-EPINEPHRINE 0.5 %-1:200000 IJ SOLN
INTRAMUSCULAR | Status: DC | PRN
  Administered 2018-03-19: 10 mL

## 2018-03-19 MED ORDER — SODIUM CHLORIDE 0.9 % IV SOLN: 250.0000 mL | INTRAVENOUS | Status: DC

## 2018-03-19 MED ORDER — OXYCODONE HCL 5 MG PO TABS
10.0000 mg | ORAL_TABLET | ORAL | Status: DC | PRN
  Administered 2018-03-19 – 2018-03-21 (×14): 10 mg via ORAL
  Filled 2018-03-19 (×14): qty 2

## 2018-03-19 MED ORDER — POTASSIUM CHLORIDE IN NACL 20-0.9 MEQ/L-% IV SOLN
INTRAVENOUS | Status: DC
  Administered 2018-03-19 – 2018-03-20 (×2): via INTRAVENOUS
  Filled 2018-03-19 (×3): qty 1000

## 2018-03-19 MED ORDER — OXYCODONE HCL 5 MG PO TABS: 5.0000 mg | ORAL_TABLET | ORAL | Status: DC | PRN

## 2018-03-19 MED ORDER — KETAMINE HCL 10 MG/ML IJ SOLN
INTRAMUSCULAR | Status: DC | PRN
  Administered 2018-03-19 (×3): 30 mg via INTRAVENOUS

## 2018-03-19 MED ORDER — LIDOCAINE-EPINEPHRINE 0.5 %-1:200000 IJ SOLN
INTRAMUSCULAR | Status: AC
  Filled 2018-03-19: qty 1

## 2018-03-19 MED ORDER — ONDANSETRON HCL 4 MG/2ML IJ SOLN
INTRAMUSCULAR | Status: DC | PRN
  Administered 2018-03-19: 4 mg via INTRAVENOUS

## 2018-03-19 MED ORDER — ROCURONIUM BROMIDE 100 MG/10ML IV SOLN
INTRAVENOUS | Status: DC | PRN
  Administered 2018-03-19: 30 mg via INTRAVENOUS
  Administered 2018-03-19 (×3): 20 mg via INTRAVENOUS
  Administered 2018-03-19: 50 mg via INTRAVENOUS
  Administered 2018-03-19: 20 mg via INTRAVENOUS

## 2018-03-19 MED ORDER — BISACODYL 5 MG PO TBEC
5.0000 mg | DELAYED_RELEASE_TABLET | Freq: Every day | ORAL | Status: DC | PRN
Start: 2018-03-19 — End: 2018-03-21

## 2018-03-19 MED ORDER — ROCURONIUM BROMIDE 10 MG/ML (PF) SYRINGE
PREFILLED_SYRINGE | INTRAVENOUS | Status: AC
  Filled 2018-03-19: qty 30

## 2018-03-19 MED ORDER — LACTATED RINGERS IV SOLN
INTRAVENOUS | Status: DC | PRN
  Administered 2018-03-19 (×2): via INTRAVENOUS

## 2018-03-19 MED ORDER — FENTANYL CITRATE (PF) 100 MCG/2ML IJ SOLN
INTRAMUSCULAR | Status: DC | PRN
  Administered 2018-03-19: 50 ug via INTRAVENOUS
  Administered 2018-03-19 (×2): 25 ug via INTRAVENOUS
  Administered 2018-03-19: 100 ug via INTRAVENOUS
  Administered 2018-03-19 (×3): 50 ug via INTRAVENOUS

## 2018-03-19 MED ORDER — 0.9 % SODIUM CHLORIDE (POUR BTL) OPTIME
TOPICAL | Status: DC | PRN
  Administered 2018-03-19: 1000 mL

## 2018-03-19 MED ORDER — PHENYLEPHRINE 40 MCG/ML (10ML) SYRINGE FOR IV PUSH (FOR BLOOD PRESSURE SUPPORT)
PREFILLED_SYRINGE | INTRAVENOUS | Status: AC
  Filled 2018-03-19: qty 10

## 2018-03-19 MED ORDER — HYDROMORPHONE HCL 1 MG/ML IJ SOLN
INTRAMUSCULAR | Status: AC
  Administered 2018-03-19: 16:00:00
  Filled 2018-03-19: qty 1

## 2018-03-19 MED ORDER — PROPOFOL 10 MG/ML IV BOLUS
INTRAVENOUS | Status: DC | PRN
  Administered 2018-03-19: 140 mg via INTRAVENOUS

## 2018-03-19 MED ORDER — MIDAZOLAM HCL 2 MG/2ML IJ SOLN: 0.5000 mg | Freq: Once | INTRAMUSCULAR | Status: DC | PRN

## 2018-03-19 MED ORDER — CEFAZOLIN SODIUM-DEXTROSE 2-3 GM-%(50ML) IV SOLR
INTRAVENOUS | Status: DC | PRN
  Administered 2018-03-19 (×2): 2 g via INTRAVENOUS

## 2018-03-19 MED ORDER — PHENYLEPHRINE 40 MCG/ML (10ML) SYRINGE FOR IV PUSH (FOR BLOOD PRESSURE SUPPORT)
PREFILLED_SYRINGE | INTRAVENOUS | Status: DC | PRN
  Administered 2018-03-19: 40 ug via INTRAVENOUS

## 2018-03-19 MED ORDER — THROMBIN (RECOMBINANT) 20000 UNITS EX SOLR
CUTANEOUS | Status: AC
  Filled 2018-03-19: qty 20000

## 2018-03-19 MED ORDER — ONDANSETRON HCL 4 MG/2ML IJ SOLN: 4.0000 mg | Freq: Four times a day (QID) | INTRAMUSCULAR | Status: DC | PRN

## 2018-03-19 MED ORDER — DIAZEPAM 5 MG PO TABS
5.0000 mg | ORAL_TABLET | Freq: Four times a day (QID) | ORAL | Status: DC | PRN
  Administered 2018-03-19 – 2018-03-21 (×6): 5 mg via ORAL
  Filled 2018-03-19 (×6): qty 1

## 2018-03-19 MED ORDER — DEXAMETHASONE SODIUM PHOSPHATE 4 MG/ML IJ SOLN
INTRAMUSCULAR | Status: DC | PRN
  Administered 2018-03-19: 10 mg via INTRAVENOUS

## 2018-03-19 MED ORDER — CHLORHEXIDINE GLUCONATE CLOTH 2 % EX PADS: 6.0000 | MEDICATED_PAD | Freq: Once | CUTANEOUS | Status: DC

## 2018-03-19 MED ORDER — PHENOL 1.4 % MT LIQD: 1.0000 | OROMUCOSAL | Status: DC | PRN

## 2018-03-19 MED ORDER — HYDROMORPHONE HCL 1 MG/ML IJ SOLN
1.0000 mg | INTRAMUSCULAR | Status: DC | PRN
  Administered 2018-03-20 (×4): 1 mg via INTRAVENOUS
  Filled 2018-03-19 (×5): qty 1

## 2018-03-19 MED ORDER — BUPIVACAINE HCL (PF) 0.5 % IJ SOLN
INTRAMUSCULAR | Status: AC
  Filled 2018-03-19: qty 30

## 2018-03-19 SURGICAL SUPPLY — 68 items
BAG DECANTER FOR FLEXI CONT (MISCELLANEOUS) ×2 IMPLANT
BENZOIN TINCTURE PRP APPL 2/3 (GAUZE/BANDAGES/DRESSINGS) IMPLANT
BLADE CLIPPER SURG (BLADE) IMPLANT
BUR MATCHSTICK NEURO 3.0 LAGG (BURR) ×2 IMPLANT
BUR PRECISION FLUTE 5.0 (BURR) ×2 IMPLANT
CAGE POST IBF 11X8D 26/9 (Cage) ×4 IMPLANT
CAGE POST IBF 13X8D 26/9 (Cage) ×4 IMPLANT
CANISTER SUCT 3000ML PPV (MISCELLANEOUS) ×2 IMPLANT
CAP RELINE MOD TULIP RMM (Cap) ×12 IMPLANT
CARTRIDGE OIL MAESTRO DRILL (MISCELLANEOUS) ×1 IMPLANT
CONT SPEC 4OZ CLIKSEAL STRL BL (MISCELLANEOUS) ×2 IMPLANT
COVER BACK TABLE 60X90IN (DRAPES) ×2 IMPLANT
DECANTER SPIKE VIAL GLASS SM (MISCELLANEOUS) ×2 IMPLANT
DERMABOND ADVANCED (GAUZE/BANDAGES/DRESSINGS) ×2
DERMABOND ADVANCED .7 DNX12 (GAUZE/BANDAGES/DRESSINGS) ×2 IMPLANT
DIFFUSER DRILL AIR PNEUMATIC (MISCELLANEOUS) ×2 IMPLANT
DRAPE C-ARM 42X72 X-RAY (DRAPES) ×4 IMPLANT
DRAPE C-ARMOR (DRAPES) ×2 IMPLANT
DRAPE LAPAROTOMY 100X72X124 (DRAPES) ×2 IMPLANT
DRAPE POUCH INSTRU U-SHP 10X18 (DRAPES) IMPLANT
DRAPE SURG 17X23 STRL (DRAPES) ×2 IMPLANT
DRSG OPSITE POSTOP 4X8 (GAUZE/BANDAGES/DRESSINGS) ×2 IMPLANT
DURAPREP 26ML APPLICATOR (WOUND CARE) ×2 IMPLANT
ELECT REM PT RETURN 9FT ADLT (ELECTROSURGICAL) ×2
ELECTRODE REM PT RTRN 9FT ADLT (ELECTROSURGICAL) ×1 IMPLANT
GAUZE SPONGE 4X4 12PLY STRL (GAUZE/BANDAGES/DRESSINGS) IMPLANT
GAUZE SPONGE 4X4 16PLY XRAY LF (GAUZE/BANDAGES/DRESSINGS) IMPLANT
GLOVE BIOGEL PI IND STRL 6.5 (GLOVE) ×1 IMPLANT
GLOVE BIOGEL PI INDICATOR 6.5 (GLOVE) ×1
GLOVE ECLIPSE 6.5 STRL STRAW (GLOVE) ×4 IMPLANT
GLOVE EXAM NITRILE LRG STRL (GLOVE) IMPLANT
GLOVE EXAM NITRILE XL STR (GLOVE) IMPLANT
GLOVE EXAM NITRILE XS STR PU (GLOVE) IMPLANT
GLOVE SURG SS PI 7.0 STRL IVOR (GLOVE) ×2 IMPLANT
GLOVE SURG SS PI 7.5 STRL IVOR (GLOVE) ×10 IMPLANT
GOWN STRL REUS W/ TWL LRG LVL3 (GOWN DISPOSABLE) ×4 IMPLANT
GOWN STRL REUS W/ TWL XL LVL3 (GOWN DISPOSABLE) ×1 IMPLANT
GOWN STRL REUS W/TWL 2XL LVL3 (GOWN DISPOSABLE) IMPLANT
GOWN STRL REUS W/TWL LRG LVL3 (GOWN DISPOSABLE) ×4
GOWN STRL REUS W/TWL XL LVL3 (GOWN DISPOSABLE) ×1
GRAFT BN 10X1XDBM MAGNIFUSE (Bone Implant) ×1 IMPLANT
GRAFT BONE MAGNIFUSE 1X10CM (Bone Implant) ×1 IMPLANT
KIT BASIN OR (CUSTOM PROCEDURE TRAY) ×2 IMPLANT
KIT POSITION SURG JACKSON T1 (MISCELLANEOUS) ×2 IMPLANT
KIT TURNOVER KIT B (KITS) ×2 IMPLANT
MILL MEDIUM DISP (BLADE) ×2 IMPLANT
NEEDLE HYPO 25X1 1.5 SAFETY (NEEDLE) ×2 IMPLANT
NEEDLE SPNL 18GX3.5 QUINCKE PK (NEEDLE) ×2 IMPLANT
NS IRRIG 1000ML POUR BTL (IV SOLUTION) ×2 IMPLANT
OIL CARTRIDGE MAESTRO DRILL (MISCELLANEOUS) ×2
PACK LAMINECTOMY NEURO (CUSTOM PROCEDURE TRAY) ×2 IMPLANT
PAD ARMBOARD 7.5X6 YLW CONV (MISCELLANEOUS) ×6 IMPLANT
PATTIES SURGICAL 1X1 (DISPOSABLE) ×2 IMPLANT
ROD RELINE COCR 5.0X60MM (Rod) ×4 IMPLANT
SCREW LOCK RSS 4.5/5.0MM (Screw) ×12 IMPLANT
SCREW SHANK RELINE 6.5X40MM (Screw) ×8 IMPLANT
SCREW SHANK RELINE MOD 6.5X35 (Screw) ×4 IMPLANT
SPONGE LAP 4X18 RFD (DISPOSABLE) ×2 IMPLANT
SPONGE SURGIFOAM ABS GEL 100 (HEMOSTASIS) ×2 IMPLANT
STRIP CLOSURE SKIN 1/2X4 (GAUZE/BANDAGES/DRESSINGS) IMPLANT
SUT PROLENE 6 0 BV (SUTURE) IMPLANT
SUT VIC AB 0 CT1 18XCR BRD8 (SUTURE) ×3 IMPLANT
SUT VIC AB 0 CT1 8-18 (SUTURE) ×3
SUT VIC AB 2-0 CT1 18 (SUTURE) ×4 IMPLANT
SUT VIC AB 3-0 SH 8-18 (SUTURE) ×6 IMPLANT
TOWEL GREEN STERILE (TOWEL DISPOSABLE) ×2 IMPLANT
TOWEL GREEN STERILE FF (TOWEL DISPOSABLE) ×2 IMPLANT
WATER STERILE IRR 1000ML POUR (IV SOLUTION) ×2 IMPLANT

## 2018-03-19 NOTE — Progress Notes (Signed)
Pt with history of drug use.   States "my pain tolerance is really high".  Patient refused scheduled gabapentin states "this does not work for me".

## 2018-03-19 NOTE — Anesthesia Postprocedure Evaluation (Signed)
Anesthesia Post Note  Patient: Curtis HuxleyJames Benson  Procedure(s) Performed: Lumbar three-four Lumbar four-five Posterior lumbar interbody fusion (N/A Spine Lumbar)     Patient location during evaluation: PACU Anesthesia Type: General Level of consciousness: awake and alert, oriented and patient cooperative Pain management: pain level controlled Vital Signs Assessment: post-procedure vital signs reviewed and stable Respiratory status: spontaneous breathing, nonlabored ventilation, respiratory function stable and patient connected to nasal cannula oxygen Cardiovascular status: blood pressure returned to baseline and stable Postop Assessment: no apparent nausea or vomiting Anesthetic complications: no    Last Vitals:  Vitals:   03/19/18 1634 03/19/18 1649  BP: 105/77 112/71  Pulse: 78 68  Resp: 16 16  Temp:    SpO2: 98% 94%    Last Pain:  Vitals:   03/19/18 1619  TempSrc:   PainSc: 6                  Latiffany Harwick,E. Khup Sapia

## 2018-03-19 NOTE — H&P (Addendum)
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 Cassandria Santee who comes in today.  He has not been seen in about 2 years, underwent 2 lumbar procedures for decompression. He comes in today secondary to pain in the back.  It doesn't feel at the stenosis pain but more like pain where he says his hips are rolling into him.   NEURO:  On exam, he has weakness in hip flexors bilaterally, 5- over 5, right slightly worse than left.  He has weakness in his adductors and abductors in the thighs, and that too would be 5-, maybe 4+.  Gait is normal.  He does have normal dorsiflexion and plantar flexion, though he is slightly limited secondary to surgery on his left ankle.   ASSESSMENT/PLAN: He says that while he has had pain for 10 years, it has gotten worse over the last year or so.  He has had some falls, just fell Sunday.  Says his legs just gave out on him.  What I will do is have him undergo plain x-ray and have him undergo an MRI of the lumbar spine with and without contrast and then see him back in the office.Curtis Benson comes in today so we could go over the MRI of his lumbar spine.  He has profound stenosis at L4-5.  He has some significant facet arthropathy at L3-4.  He had undergone a multilevel lumbar laminectomy without instrumentation.  Given the fact that his stenosis so bad at 4-5 and the facet overgrowth is so significant, I don't think this can be performed without him undergoing an arthrodesis afterwards.  Therefore, I will do this, I will also take a very hard look at 3-4 as foraminal narrowing is certainly present.  I will schedule him for the operating room on this basis.  He did ask for pain medication.  He had been receiving pain medication for over 10 years.  He said Medicaid stopped that.  Then he was receiving Suboxone and then he said he canceled  the visit to see doctor giving him Suboxone.  When I asked him why he would do that, he said because it wasn't helping, but he wanted some narcotics from me.  I explained to him that he had been like this for well over a year and that I was not willing to give him any narcotics until he had his operation.  He said he understood and he would just have to hurt.   He is admitted for a lumbar decompression and fusion.  No Known Allergies Past Medical History:  Diagnosis Date  . Arthritis   . Chronic back pain   . DDD (degenerative disc disease), lumbar   . Emphysema of lung (HCC)   . Hypertension    pt. states blood pressure meds drop him too low, controlled diet/decreasesalt intake  . Shortness of breath dyspnea    due to weather  . Tendonitis    RUE  . Wears glasses    Past Surgical History:  Procedure Laterality Date  . BACK SURGERY     anterior cervical fusion  . FORAMINOTOMY 3 LEVEL  09/07/2015   lumbar 3,4 ,5  sacral one   . FRACTURE SURGERY     left ankle  . HERNIA REPAIR    . LUMBAR LAMINECTOMY/DECOMPRESSION  MICRODISCECTOMY N/A 11/02/2014   Procedure:  Lumbar Laminectomy Lumbar Two, Three, Four, Five;  Surgeon: Karn CassisErnesto M Botero, MD;  Location: [facility];  Service: Neurosurgery;  Laterality: N/A;   Lumbar Laminectomy Lumbar Two, Three, Four, Five  . LUMBAR LAMINECTOMY/DECOMPRESSION MICRODISCECTOMY Right 09/07/2015   Procedure: RIGHT Lumbar three-four, lumbar four-five, lumbar five -sacral one  FORAMINOTOMY;  Surgeon: Hilda LiasErnesto Botero, MD;  Location: [facility];  Service: Neurosurgery;  Laterality: Right;  . MULTIPLE TOOTH EXTRACTIONS     Family History  Problem Relation Age of Onset  . Diabetes Brother    Social History   Socioeconomic History  . Marital status: Single    Spouse name: Not on file  . Number of children: Not on file  . Years of education: Not on file  . Highest education level: Not on file  Occupational History  . Not on file  Social Needs  . Financial  resource strain: Not on file  . Food insecurity:    Worry: Not on file    Inability: Not on file  . Transportation needs:    Medical: Not on file    Non-medical: Not on file  Tobacco Use  . Smoking status: Current Every Day Smoker    Packs/day: 1.00    Years: 35.00    Pack years: 35.00    Types: Cigarettes  . Smokeless tobacco: Never Used  Substance and Sexual Activity  . Alcohol use: Yes    Comment: rare  . Drug use: Yes    Types: Marijuana    Comment: once  a week  . Sexual activity: Not on file  Lifestyle  . Physical activity:    Days per week: Not on file    Minutes per session: Not on file  . Stress: Not on file  Relationships  . Social connections:    Talks on phone: Not on file    Gets together: Not on file    Attends religious service: Not on file    Active member of club or organization: Not on file    Attends meetings of clubs or organizations: Not on file    Relationship status: Not on file  . Intimate partner violence:    Fear of current or ex partner: Not on file    Emotionally abused: Not on file    Physically abused: Not on file    Forced sexual activity: Not on file  Other Topics Concern  . Not on file  Social History Narrative  . Not on file   Physical Exam  Constitutional: He is oriented to person, place, and time. He appears well-developed and well-nourished. No distress.  HENT:  Head: Normocephalic and atraumatic.  Eyes: Pupils are equal, round, and reactive to light. EOM are normal.  Neck: Normal range of motion. Neck supple.  Cardiovascular: Normal rate and regular rhythm.  Pulmonary/Chest: Effort normal and breath sounds normal.  Abdominal: Soft. Bowel sounds are normal.  Musculoskeletal: Normal range of motion.  Neurological: He is alert and oriented to person, place, and time. He displays normal reflexes. No cranial nerve deficit or sensory deficit. He exhibits normal muscle tone. Coordination normal.  Skin: Skin is warm and dry.   Or  for plif l3/4,4/5

## 2018-03-19 NOTE — Op Note (Signed)
03/19/2018  8:30 PM  PATIENT:  Curtis Benson  52 y.o. male  PRE-OPERATIVE DIAGNOSIS:  Spinal stenosis, Lumbar region with neurogenic claudication, L3/4,4/5 POST-OPERATIVE DIAGNOSIS:  Spinal stenosis, Lumbar region with neurogenic claudication L3/4,4/5  PROCEDURE:  Procedure(s): L3, 4 laminectomies and inferior facetectomies beyond the needed exposure and work for a PLIF Lumbar three-four Lumbar four-five Posterior lumbar interbody fusion, synthes opal cages, autograft morsels Segmental pedicle screw fixation (nuvasive) L3-5  SURGEON:  Surgeon(s): Coletta Memosabbell, Ricarda Atayde, MD Tia AlertJones, David S, MD  ASSISTANTS:Jones, Onalee Huaavid  ANESTHESIA:   general  EBL:  Total I/O In: -  Out: 250 [Urine:250]  BLOOD ADMINISTERED:none  CELL SAVER GIVEN:none  COUNT:per nursing  DRAINS: none   SPECIMEN:  No Specimen  DICTATION: Curtis Benson is a 52 y.o. male whom was taken to the operating room intubated, and placed under a general anesthetic without difficulty. A foley catheter was placed under sterile conditions. He was positioned prone on a Jackson stable with all pressure points properly padded.  His lumbar region was prepped and draped in a sterile manner. I infiltrated 10cc's 1/2%lidocaine/1:2000,000 strength epinephrine into the planned incision. I opened the skin with a 10 blade and took the incision down to the thoracolumbar fascia. I exposed the lamina of L2,3,4, and 5 in a subperiosteal fashion bilaterally. I confirmed my location with an intraoperative xray.  I placed self retaining retractors and started the decompression.  I decompressed the spinal canal via laminectomies and inferior facetectomies of L3, and L4 bilaterally. I used the drill, Kerrison punches, and the rongeur to remove the bone and the ligamentum flavum. I unroofed the neural foramina at L3/4, and 4/5 bilaterally fully decompressing the nerve roots. The laminectomies allowed for a decompression of the spinal canal. I exposed the lateral disc  spaces which was free of the scar tissue. The facets were quite loose and degraded given the previous laminectomy for decompression. I removed a great deal of scar tissue.  PLIF's were performed at L3/4, and 4/5 in the same fashion. I opened the disc space with a 15 blade then used a variety of instruments to remove the disc and prepare the space for the arthrodesis. I used curettes, rongeurs, punches, shavers for the disc space, and rasps in the discetomy. I measured the disc space and placed 4  Peek cages(Synthes) filled with autograft into the disc space(s). I placed more autograft around the cages. I decorticated the lateral bone at L3,4,and 5. I then placed allograft morsels on the decorticated surfaces to complete the posterolateral arthrodesis.  We placed pedicle screws at L3, 4, and 5, using fluoroscopic guidance. I drilled a pilot hole, then cannulated the pedicle with a drill at each site. We then tapped each pedicle, assessing each site for pedicle violations. No cutouts were appreciated. Screws (nuvasive) were then placed at each site without difficulty. We attached rods and locking caps with the appropriate tools. The locking caps were secured with torque limited screwdrivers. Final films were performed and the final construct appeared to be in good position.  I closed the wound in a layered fashion. I approximated the thoracolumbar fascia, subcutaneous, and subcuticular planes with vicryl sutures. I used dermabond and an occlusive bandage for a sterile dressing.     PLAN OF CARE: Admit to inpatient   PATIENT DISPOSITION:  PACU - hemodynamically stable.   Delay start of Pharmacological VTE agent (>24hrs) due to surgical blood loss or risk of bleeding:  yes

## 2018-03-19 NOTE — Progress Notes (Signed)
Orthopedic Tech Progress Note Patient Details:  Curtis HuxleyJames Tilmon Jun 06, 1966 [id number]  Patient ID: Curtis Benson, male   DOB: Jun 06, 1966, 52 y.o.   MRN: [id number]   [name], [name] 03/19/2018, 4:13 PM Called in bio-tech brace order; [facility] with [facility]

## 2018-03-19 NOTE — Transfer of Care (Signed)
Immediate Anesthesia Transfer of Care Note  Patient: Curtis Benson  Procedure(s) Performed: Lumbar three-four Lumbar four-five Posterior lumbar interbody fusion (N/A Spine Lumbar)  Patient Location: PACU  Anesthesia Type:General  Level of Consciousness: awake, alert  and oriented  Airway & Oxygen Therapy: Patient Spontanous Breathing and Patient connected to nasal cannula oxygen  Post-op Assessment: Report given to RN and Post -op Vital signs reviewed and stable  Post vital signs: Reviewed and stable  Last Vitals:  Vitals Value Taken Time  BP 107/93 03/19/2018  3:17 PM  Temp    Pulse 104 03/19/2018  3:24 PM  Resp 8 03/19/2018  3:24 PM  SpO2 99 % 03/19/2018  3:24 PM  Vitals shown include unvalidated device data.  Last Pain:  Vitals:   03/19/18 0640  TempSrc:   PainSc: 8       Patients Stated Pain Goal: 2 (03/19/18 0640)  Complications: No apparent anesthesia complications

## 2018-03-19 NOTE — Anesthesia Procedure Notes (Addendum)
Procedure Name: Intubation Date/Time: 03/19/2018 8:37 AM Performed by: Jairo BenJackson, [place], MD Pre-anesthesia Checklist: Patient identified, Emergency Drugs available, Suction available, Patient being monitored and Timeout performed Patient Re-evaluated:Patient Re-evaluated prior to induction Oxygen Delivery Method: Circle system utilized Preoxygenation: Pre-oxygenation with 100% oxygen Induction Type: IV induction Ventilation: Mask ventilation without difficulty Laryngoscope Size: Miller and 3 Grade View: Grade I Tube type: Oral Tube size: 7.5 mm Number of attempts: 1 Airway Equipment and Method: Stylet Placement Confirmation: ETT inserted through vocal cords under direct vision,  positive ETCO2 and breath sounds checked- equal and bilateral Secured at: 22 cm Tube secured with: Tape Dental Injury: Teeth and Oropharynx as per pre-operative assessment

## 2018-03-20 ENCOUNTER — Encounter (HOSPITAL_COMMUNITY): Payer: Self-pay | Admitting: General Practice

## 2018-03-20 ENCOUNTER — Other Ambulatory Visit: Payer: Self-pay

## 2018-03-20 MED ORDER — OXYCODONE HCL ER 10 MG PO T12A: 5.0000 mg | EXTENDED_RELEASE_TABLET | Freq: Once | ORAL | Status: DC

## 2018-03-20 MED ORDER — OXYCODONE HCL ER 20 MG PO T12A
20.0000 mg | EXTENDED_RELEASE_TABLET | Freq: Two times a day (BID) | ORAL | Status: DC
  Administered 2018-03-20 – 2018-03-21 (×2): 20 mg via ORAL
  Filled 2018-03-20 (×2): qty 1

## 2018-03-20 MED FILL — Thrombin (Recombinant) For Soln 20000 Unit: CUTANEOUS | Qty: 1 | Status: AC

## 2018-03-20 NOTE — Evaluation (Signed)
Occupational Therapy Evaluation Patient Details Name: Curtis Benson MRN: [id number] DOB: [birth year] Today's Date: 03/20/2018    History of Present Illness Patient is a 52 y/o male admitted for L3-4, L4-5 PLIF with PMH of previous decompression lamy/microdisc and an ACDF, HTN, emphysema, chronic back pain, arthritis.   Clinical Impression   PTA patient reports independent with ADL, limited IADL. Currently patient limited by pain and weakness in back/LEs, requiring setup assist for UB ADL seated, mod assist for LB ADL, min guard assist for toilet transfers using RW, min guard assist for toileting, min guard for grooming standing, and min guard for simulated tub transfers.  Used RW for all mobility, and required cueing to adhere to spinal precautions.  Educated on precautions, safety, brace wear schedule/donning position, modified techniques for ADL participation, and recommendations.  Pt initially reporting plans to stand in shower, but after education with brace application and precautions patient agreeable to completing task seated. Patient will have support of spouse 24/7 at discharge, do not anticipate further OT needs after discharge.  Will continue to follow while admitted, in order to maximize safety and independence with ADLs and mobility.     Follow Up Recommendations  No OT follow up;Supervision/Assistance - 24 hour    Equipment Recommendations  None recommended by OT    Recommendations for Other Services       Precautions / Restrictions Precautions Precautions: Back Precaution Booklet Issued: Yes (comment) Precaution Comments: issued and reviewed thoroughly with patient and spouse Required Braces or Orthoses: Spinal Brace Spinal Brace: Lumbar corset;Other (comment) Spinal Brace Comments: donned upon entry Restrictions Weight Bearing Restrictions: No      Mobility Bed Mobility Overal bed mobility: Needs Assistance Bed Mobility: Rolling;Sidelying to Sit Rolling: Min  assist Sidelying to sit: Min assist;HOB elevated       General bed mobility comments: seated in recliner upon therapist entering room  Transfers Overall transfer level: Needs assistance Equipment used: Rolling walker (2 wheeled) Transfers: Sit to/from Stand Sit to Stand: Min guard         General transfer comment: cueing for hand placement and safety, increased time required     Balance Overall balance assessment: Needs assistance Sitting-balance support: No upper extremity supported;Feet supported Sitting balance-Leahy Scale: Good     Standing balance support: During functional activity;Bilateral upper extremity supported Standing balance-Leahy Scale: Poor Standing balance comment: reliant on BUE support                           ADL either performed or assessed with clinical judgement   ADL Overall ADL's : Needs assistance/impaired Eating/Feeding: Supervision/ safety;Sitting   Grooming: Min guard;Standing;Cueing for compensatory techniques Grooming Details (indicate cue type and reason): educated on compensatory techniques for back precautions Upper Body Bathing: Set up;Sitting   Lower Body Bathing: Minimal assistance;Sitting/lateral leans;Cueing for back precautions;Cueing for compensatory techniques Lower Body Bathing Details (indicate cue type and reason): educated on precautions and safety, compelting bathing seated with lateral leans/using long sponge or having assistnace from spouse for B feet Upper Body Dressing : Set up;Sitting   Lower Body Dressing: Sit to/from stand;Cueing for back precautions;Cueing for compensatory techniques;Moderate assistance Lower Body Dressing Details (indicate cue type and reason): decreased reach to B feet, unable to complete figure 4 technique at this time due to pain; reports spouse will assist at [employer] Transfer: Min guard;Cueing for sequencing;Cueing for safety;Ambulation;Comfort height toilet;Grab bars;RW Toilet  Transfer Details (indicate cue type and reason): cueing  for hand placement and safety, adherance to precautions  Toileting- Clothing Manipulation and Hygiene: Min guard;Sit to/from stand;Cueing for compensatory techniques;Cueing for back precautions Toileting - Clothing Manipulation Details (indicate cue type and reason): educated on compenatory techniques for back precautions and safety  Tub/ Shower Transfer: Min guard;Tub transfer;3 in 1;Ambulation;Rolling walker;Cueing for sequencing;Adhering to back precautions Tub/Shower Transfer Details (indicate cue type and reason): educated on importance for bathing seated due to back precautions and surgeons orders; patient agreeable to use 3:1 in tub (as chair will not fit in walk in shower).  Patient educated on and demonstrated backwards step using RW and 3:1 with min guard Functional mobility during ADLs: Min guard;Supervision/safety;Rolling walker(min guard initally, fading to supervision for safety ) General ADL Comments: complete transfers and short distance mobility, spouse supportive and will assist as needed; limited by pain and stiffness today      Vision Baseline Vision/History: Wears glasses Wears Glasses: At all times Patient Visual Report: No change from baseline Vision Assessment?: No apparent visual deficits     Perception     Praxis      Pertinent Vitals/Pain Pain Assessment: Faces Faces Pain Scale: Hurts even more Pain Location: in back and legs due to surgery Pain Descriptors / Indicators: Grimacing;Guarding;Operative site guarding Pain Intervention(s): Limited activity within patient's tolerance;Premedicated before session;Monitored during session     Hand Dominance Right   Extremity/Trunk Assessment Upper Extremity Assessment Upper Extremity Assessment: Overall WFL for tasks assessed   Lower Extremity Assessment Lower Extremity Assessment: Defer to PT evaluation   Cervical / Trunk Assessment Cervical / Trunk  Assessment: Other exceptions Cervical / Trunk Exceptions: s/p lumbar fusion   Communication Communication Communication: No difficulties   Cognition Arousal/Alertness: Awake/alert Behavior During Therapy: WFL for tasks assessed/performed Overall Cognitive Status: Within Functional Limits for tasks assessed                                     General Comments  spouse present and supportive    Exercises     Shoulder Instructions      Home Living Family/patient expects to be discharged to:: Private residence Living Arrangements: Spouse/significant other Available Help at Discharge: Family Type of Home: Mobile home Home Access: Stairs to enter Entrance Stairs-Number of Steps: 3 Entrance Stairs-Rails: Right;Left Home Layout: One level     Bathroom Shower/Tub: Tub/shower unit;Walk-in shower(walk in shower too small for chair, tub shower possible)   Bathroom Toilet: Standard Bathroom Accessibility: Yes How Accessible: Accessible via walker Home Equipment: Walker - 2 wheels;Cane - single point;Bedside commode          Prior Functioning/Environment Level of Independence: Independent with assistive device(s)        Comments: pt reports using cane at times, but otherwise independent with ADL, limited IADLs        OT Problem List: Decreased strength;Decreased activity tolerance;Impaired balance (sitting and/or standing);Decreased safety awareness;Decreased knowledge of use of DME or AE;Decreased knowledge of precautions;Pain      OT Treatment/Interventions: Self-care/ADL training;Energy conservation;DME and/or AE instruction;Therapeutic activities;Patient/family education;Balance training    OT Goals(Current goals can be found in the care plan section) Acute Rehab OT Goals Patient Stated Goal: to get pain controlled OT Goal Formulation: With patient Time For Goal Achievement: 04/03/18 Potential to Achieve Goals: Good  OT Frequency: Min 2X/week    Barriers to D/C:            Co-evaluation  AM-PAC PT "6 Clicks" Daily Activity     Outcome Measure Help from another person eating meals?: None Help from another person taking care of personal grooming?: A Little Help from another person toileting, which includes using toliet, bedpan, or urinal?: A Little Help from another person bathing (including washing, rinsing, drying)?: A Little Help from another person to put on and taking off regular upper body clothing?: None Help from another person to put on and taking off regular lower body clothing?: A Lot 6 Click Score: 19   End of Session Equipment Utilized During Treatment: Rolling walker;Back brace Nurse Communication: Mobility status  Activity Tolerance: Patient tolerated treatment well Patient left: in chair;with call bell/phone within reach;with family/visitor present  OT Visit Diagnosis: Other abnormalities of gait and mobility (R26.89);Muscle weakness (generalized) (M62.81);Pain Pain - part of body: (back)                Time: 1610-9604 OT Time Calculation (min): 26 min Charges:  OT General Charges $OT Visit: 1 Visit OT Evaluation $OT Eval Moderate Complexity: 1 Mod OT Treatments $Self Care/Home Management : 8-22 mins G-Codes:     Chancy Milroy, OTR/L  Pager [phone]   Chancy Milroy 03/20/2018, 3:42 PM

## 2018-03-20 NOTE — Progress Notes (Addendum)
Discussed with pt pain med management as related to previous surgery. Educated pt on current pain management orders. Pt verbalized understanding.   [place] Pain [place] called pt received 8-2mg  of suboxone film twice a day

## 2018-03-20 NOTE — Addendum Note (Signed)
Addendum  created 03/20/18 1104 by Caren Macadamarter, Alonie Gazzola W, CRNA   Cosign clinical note

## 2018-03-20 NOTE — Evaluation (Signed)
Physical Therapy Evaluation Patient Details Name: Curtis Benson MRN: [id number] DOB: Jan 08, 1966 Today's Date: 03/20/2018   History of Present Illness  Patient is a 52 y/o male admitted for L3-4, L4-5 PLIF with PMH of previous decompression lamy/microdisc and an ACDF, HTN, emphysema.  Clinical Impression  Patient presents with decreased independence with mobility with pain and decreased ROM and decreased knowledge of precautions.  He will benefit from skilled PT in the acute setting to allow d/c home with assist.  Likely not to need follow up PT at d/c.  Will follow acutely.    Follow Up Recommendations Supervision/Assistance - 24 hour;No PT follow up    Equipment Recommendations  None recommended by PT    Recommendations for Other Services       Precautions / Restrictions Precautions Precautions: Back Required Braces or Orthoses: Spinal Brace Spinal Brace: Applied in sitting position;Lumbar corset      Mobility  Bed Mobility Overal bed mobility: Needs Assistance Bed Mobility: Rolling;Sidelying to Sit Rolling: Min assist Sidelying to sit: Min assist;HOB elevated       General bed mobility comments: educated on precautions and technique, assist for safety and technique and lifting trunk to sit  Transfers Overall transfer level: Needs assistance Equipment used: Rolling walker (2 wheeled) Transfers: Sit to/from Stand Sit to Stand: Min assist         General transfer comment: increased time, slowly extending legs due to weakness/pain  Ambulation/Gait Ambulation/Gait assistance: Min assist Gait Distance (Feet): 90 Feet Assistive device: Rolling walker (2 wheeled) Gait Pattern/deviations: Step-through pattern;Decreased stride length;Narrow base of support;Trunk flexed;Antalgic     General Gait Details: slow pace, elevation of shoulders with stiffness throughout spine, assist to turn walker in hallway  Stairs            Wheelchair Mobility    Modified Rankin  (Stroke Patients Only)       Balance Overall balance assessment: Needs assistance   Sitting balance-Leahy Scale: Good     Standing balance support: Bilateral upper extremity supported Standing balance-Leahy Scale: Poor Standing balance comment: UE support needed due to LE weakness/pain                             Pertinent Vitals/Pain Pain Assessment: Faces Faces Pain Scale: Hurts worst Pain Location: in back and legs due to surgery Pain Descriptors / Indicators: Grimacing;Guarding;Operative site guarding Pain Intervention(s): Monitored during session;Repositioned;RN gave pain meds during session    Home Living Family/patient expects to be discharged to:: Private residence Living Arrangements: Spouse/significant other Available Help at Discharge: Family Type of Home: Mobile home Home Access: Stairs to enter Entrance Stairs-Rails: Doctor, general practice of Steps: 3 Home Layout: One level Home Equipment: Environmental consultant - 2 wheels;Cane - single point;Bedside commode      Prior Function Level of Independence: Independent with assistive device(s)         Comments: used cane at times     Hand Dominance   Dominant Hand: Right    Extremity/Trunk Assessment   Upper Extremity Assessment Upper Extremity Assessment: Defer to OT evaluation    Lower Extremity Assessment Lower Extremity Assessment: Generalized weakness    Cervical / Trunk Assessment Cervical / Trunk Assessment: Other exceptions Cervical / Trunk Exceptions: stiff back and increased muscle guarding  Communication   Communication: No difficulties  Cognition Arousal/Alertness: Awake/alert Behavior During Therapy: WFL for tasks assessed/performed Overall Cognitive Status: Within Functional Limits for tasks assessed  General Comments General comments (skin integrity, edema, etc.): Educated on sitting 30 min to 1 hour max prior to  repositioning, use of brace, walking for exercise and need for assist initially.    Exercises     Assessment/Plan    PT Assessment Patient needs continued PT services  PT Problem List Decreased strength;Decreased mobility;Decreased safety awareness;Decreased knowledge of use of DME;Decreased knowledge of precautions;Decreased activity tolerance       PT Treatment Interventions DME instruction;Therapeutic activities;Gait training;Patient/family education;Balance training;Stair training;Functional mobility training    PT Goals (Current goals can be found in the Care Plan section)  Acute Rehab PT Goals Patient Stated Goal: to get pain controlled PT Goal Formulation: With patient Time For Goal Achievement: 03/25/18 Potential to Achieve Goals: Good    Frequency Min 5X/week   Barriers to discharge        Co-evaluation               AM-PAC PT "6 Clicks" Daily Activity  Outcome Measure Difficulty turning over in bed (including adjusting bedclothes, sheets and blankets)?: Unable Difficulty moving from lying on back to sitting on the side of the bed? : Unable Difficulty sitting down on and standing up from a chair with arms (e.g., wheelchair, bedside commode, etc,.)?: Unable Help needed moving to and from a bed to chair (including a wheelchair)?: A Little Help needed walking in hospital room?: A Little Help needed climbing 3-5 steps with a railing? : A Lot 6 Click Score: 11    End of Session Equipment Utilized During Treatment: Gait belt;Back brace Activity Tolerance: Patient limited by pain Patient left: with call bell/phone within reach;in chair;with family/visitor present   PT Visit Diagnosis: Difficulty in walking, not elsewhere classified (R26.2);Pain Pain - part of body: (back)    Time: 1030-1100 PT Time Calculation (min) (ACUTE ONLY): 30 min   Charges:   PT Evaluation $PT Eval Moderate Complexity: 1 Mod PT Treatments $Gait Training: 8-22 mins   PT G CodesSheran Lawless:         Cyndi Wynn, [place] [phone] 03/20/2018   Elray Mcgregorynthia Wynn 03/20/2018, 1:26 PM

## 2018-03-20 NOTE — Progress Notes (Signed)
Patient ID: Curtis Benson, male   DOB: 14-Feb-1966, 52 y.o.   MRN: [id number] BP 108/62 (BP Location: Left Arm)   Pulse 87   Temp 97.8 F (36.6 C) (Oral)   Resp (!) 22   Wt 56.6 kg (124 lb 12.5 oz)   SpO2 99%   BMI 17.90 kg/m  Alert and oriented x 4 speech is clear and fluent Moving all extremities well Wound is clean, dry, no signs of infection

## 2018-03-20 NOTE — Progress Notes (Signed)
Pt verbalizes concern with pain management, Pt c/o pain 9/10 unrelieved after pain medication. Pt has called MD office twice to communicated with MD/Cabbell. MD/Cabbell updated this RN to request pt to have Pain management doctor to call him for Pain Management recommendations. Pt verbalized stopped seeing Pain Management MD/Steven Orvan FalconerCampbell and last dose was on 02/18/2018. New Pain Medication orders received. Pt has also verbalized plan to leaver AMA. Nursing will update MD/ Cabbell and cont to monitor pt.

## 2018-03-20 NOTE — Care Management Note (Signed)
Case Management Note  Patient Details  Name: Curtis HuxleyJames Guardino MRN: [id number] Date of Birth: 07-Oct-1965  Subjective/Objective:  Patient is a 52 y/o male admitted for L3-4, L4-5 PLIF on 03/19/18.  PTA, pt independent with assistive devices, lives at home with spouse.                  Action/Plan: PT/OT recommending no OP follow up.  Wife able to provide assistance at dc.   Expected Discharge Date:  03/24/18               Expected Discharge Plan:  Home/Self Care  In-House Referral:     Discharge planning Services  CM Consult  Post Acute Care Choice:    Choice offered to:     DME Arranged:    DME Agency:     HH Arranged:    HH Agency:     Status of Service:  In process, will continue to follow  If discussed at Long Length of Stay Meetings, dates discussed:    Additional Comments:  Glennon Macmerson, Payton Prinsen M, RN 03/20/2018, 4:34 PM

## 2018-03-21 MED ORDER — TIZANIDINE HCL 4 MG PO TABS: 4.0000 mg | ORAL_TABLET | Freq: Four times a day (QID) | ORAL | 0 refills | Status: AC | PRN

## 2018-03-21 MED ORDER — OXYCODONE HCL 10 MG PO TABS: 10.0000 mg | ORAL_TABLET | Freq: Four times a day (QID) | ORAL | 0 refills | Status: AC | PRN

## 2018-03-21 MED ORDER — OXYCODONE HCL ER 20 MG PO T12A: 20.0000 mg | EXTENDED_RELEASE_TABLET | Freq: Two times a day (BID) | ORAL | 0 refills | Status: AC

## 2018-03-21 NOTE — Progress Notes (Signed)
Patient given discharge instructions including printed prescriptions, no equipment to give.  IV removed.  Belongings with patient.  Patient taken to car via wheelchair.

## 2018-03-21 NOTE — Progress Notes (Signed)
Occupational Therapy Treatment Patient Details Name: Curtis HuxleyJames Benson MRN: [id number] DOB: 11-08-1965 Today's Date: 03/21/2018    History of present illness Patient is a 52 y/o male admitted for L3-4, L4-5 PLIF with PMH of previous decompression lamy/microdisc and an ACDF, HTN, emphysema, chronic back pain, arthritis.   OT comments  Patient progressing well.  Requires increased time and effort for transfers and mobility.  Good recall of precautions, brace management, and tub transfer technique; intermittent cueing for safety with walker management.  Requires min guard for simulated tub transfer, but supervision for toileting and grooming standing.  Will continue to follow while admitted.      Follow Up Recommendations  No OT follow up;Supervision/Assistance - 24 hour    Equipment Recommendations  None recommended by OT    Recommendations for Other Services      Precautions / Restrictions Precautions Precautions: Back Precaution Comments: reviewed precautions, able to recall 3/3  Required Braces or Orthoses: Spinal Brace Spinal Brace: Lumbar corset;Applied in sitting position Restrictions Weight Bearing Restrictions: No       Mobility Bed Mobility Overal bed mobility: Modified Independent Bed Mobility: Sidelying to Sit;Rolling Rolling: Modified independent (Device/Increase time) Sidelying to sit: Modified independent (Device/Increase time);HOB elevated       General bed mobility comments: no cueing required, no physical assist  Transfers Overall transfer level: Needs assistance   Transfers: Sit to/from Stand Sit to Stand: Supervision         General transfer comment: demonstrates good recall of hand placement and safet y    Balance Overall balance assessment: Needs assistance Sitting-balance support: No upper extremity supported;Feet supported       Standing balance support: No upper extremity supported;During functional activity Standing balance-Leahy Scale:  Poor Standing balance comment: able to wash hands with 0 hand support, limited tolerance and reliant on B UE support                           ADL either performed or assessed with clinical judgement   ADL Overall ADL's : Needs assistance/impaired     Grooming: Standing;Cueing for safety;Supervision/safety;Wash/dry Programmer, applicationshands Grooming Details (indicate cue type and reason): cueing for safety and walker mgmt      Lower Body Bathing: Minimal assistance;Sitting/lateral leans;Cueing for back precautions;Cueing for compensatory techniques Lower Body Bathing Details (indicate cue type and reason): reviewed safety and precautions, seqeuncing with brace management         Toilet Transfer: Supervision/safety;RW;BSC;Ambulation;Cueing for safety Toilet Transfer Details (indicate cue type and reason): demonstrating good hand placement and safety, adhering to precautions; increased time required Toileting- Clothing Manipulation and Hygiene: Supervision/safety;Sit to/from stand;Adhering to back precautions Toileting - Clothing Manipulation Details (indicate cue type and reason): standing at commode  Tub/ Shower Transfer: Min guard;Tub transfer;3 in 1;Ambulation;Rolling walker;Adhering to back precautions Tub/Shower Transfer Details (indicate cue type and reason): good recall of technique for backwards step over simulated tub, min guard for safety  Functional mobility during ADLs: Min guard;Supervision/safety;Rolling walker General ADL Comments: increased time and effort required for all tasks, initally min guard fading to supervision     Vision       Perception     Praxis      Cognition Arousal/Alertness: Awake/alert Behavior During Therapy: WFL for tasks assessed/performed Overall Cognitive Status: Within Functional Limits for tasks assessed  Exercises     Shoulder Instructions       General Comments spouse present  and supportive    Pertinent Vitals/ Pain       Pain Assessment: Faces Faces Pain Scale: Hurts even more Pain Location: incision Pain Descriptors / Indicators: Aching;Guarding Pain Intervention(s): Repositioned;Monitored during session  Home Living                                          Prior Functioning/Environment              Frequency  Min 2X/week        Progress Toward Goals  OT Goals(current goals can now be found in the care plan section)  Progress towards OT goals: Progressing toward goals  Acute Rehab OT Goals Patient Stated Goal: home today OT Goal Formulation: With patient Time For Goal Achievement: 04/03/18 Potential to Achieve Goals: Good  Plan Discharge plan remains appropriate;Frequency remains appropriate    Co-evaluation                 AM-PAC PT "6 Clicks" Daily Activity     Outcome Measure   Help from another person eating meals?: None Help from another person taking care of personal grooming?: None Help from another person toileting, which includes using toliet, bedpan, or urinal?: A Little Help from another person bathing (including washing, rinsing, drying)?: A Little Help from another person to put on and taking off regular upper body clothing?: None Help from another person to put on and taking off regular lower body clothing?: A Lot 6 Click Score: 20    End of Session Equipment Utilized During Treatment: Rolling walker;Back brace  OT Visit Diagnosis: Other abnormalities of gait and mobility (R26.89);Muscle weakness (generalized) (M62.81);Pain Pain - part of body: (back)   Activity Tolerance Patient tolerated treatment well   Patient Left in chair;with call bell/phone within reach;with family/visitor present   Nurse Communication Mobility status        Time: 1405-1430 OT Time Calculation (min): 25 min  Charges: OT General Charges $OT Visit: 1 Visit OT Treatments $Self Care/Home Management : 23-37  mins  Chancy Milroy, OTR/L  Pager [phone]    Chancy Milroy 03/21/2018, 3:04 PM

## 2018-03-21 NOTE — Progress Notes (Signed)
MD office paged in regards to patient's request to have MD here and answer questions about being d/c'd and pain medications.  MD office to page MD.

## 2018-03-21 NOTE — Discharge Instructions (Signed)

## 2018-03-21 NOTE — Discharge Summary (Signed)
Physician Discharge Summary  Patient ID: Curtis Benson MRN: [id number] DOB/AGE: 51-Sep-1967 51 y.o.  Admit date: 03/19/2018 Discharge date: 7/19/2019Lois Benson  Admission Diagnoses:Lumbar stenosis with neurogenic claudication  Discharge Diagnoses:  Active Problems:   Lumbar stenosis with neurogenic claudication   Discharged Condition: good  Hospital Course: Curtis Benson was admitted and taken to the operating room for a lumbar decompression and fusion with Plif at L3/4, and 4/5 bilaterally. Post op he has had improvement in his claudication. His wound is clean, and dry.  He has full strength in the lower extremities. He is voiding, ambulating and tolerating a regular diet at discharge.  Treatments: surgery: L3/4,4/5 Plif , pedicle screw fixation L3-5, posterolateral arthrodesis L3-5 , allograft morselsNuvasive pedicle screws, Opal metal cages with autograft morsels  Discharge Exam: Blood pressure 107/62, pulse 82, temperature 98 F (36.7 C), temperature source Oral, resp. rate 16, height 5\' 10"  (1.778 m), weight 59.4 kg (131 lb), SpO2 96 %. General appearance: alert, cooperative and no distress  Disposition: Discharge disposition: 01-Home or Self Care      Spinal stenosis, Lumbar region with neurogenic claudication  Allergies as of 03/21/2018   No Known Allergies     Medication List    TAKE these medications   BC HEADACHE PO Take 1-2 packets by mouth 2 (two) times daily as needed (pain).   ibuprofen 200 MG tablet Commonly known as:  ADVIL,MOTRIN Take 800-1,000 mg by mouth 2 (two) times daily as needed for headache or moderate pain.   Oxycodone HCl 10 MG Tabs Take 1 tablet (10 mg total) by mouth every 6 (six) hours as needed for severe pain ((score 7 to 10)).   oxyCODONE 20 mg 12 hr tablet Commonly known as:  OXYCONTIN Take 1 tablet (20 mg total) by mouth every 12 (twelve) hours.   tiZANidine 4 MG tablet Commonly known as:  ZANAFLEX Take 1 tablet (4 mg total) by mouth every 6  (six) hours as needed for muscle spasms.      Follow-up Information    Coletta Memosabbell, Jaia Alonge, MD Follow up in 3 week(s).   Specialty:  Neurosurgery Why:  call the office to make an appointment Contact information: [address] [place] [address] [phone]           Signed: Carmela Benson,Rawn Quiroa L 03/21/2018, 4:57 PM

## 2018-03-21 NOTE — Progress Notes (Signed)
Physical Therapy Treatment Patient Details Name: Curtis Benson MRN: [id number] DOB: 1966-04-19 Today's Date: 03/21/2018    History of Present Illness Patient is a 52 y/o male admitted for L3-4, L4-5 PLIF with PMH of previous decompression lamy/microdisc and an ACDF, HTN, emphysema, chronic back pain, arthritis.    PT Comments    Pt reports sleeping well but waking up with intense pain due to no meds while sleeping. Pt with improved gait and transfers and able to verbalize precautions with handout reviewed. Pt appropriate for return home.     Follow Up Recommendations  Supervision/Assistance - 24 hour;No PT follow up     Equipment Recommendations  None recommended by PT    Recommendations for Other Services       Precautions / Restrictions Precautions Precautions: Back Precaution Comments: issued and reviewed thoroughly with patient and fiancee Required Braces or Orthoses: Spinal Brace Spinal Brace: Lumbar corset;Applied in sitting position Spinal Brace Comments: donned upon entry    Mobility  Bed Mobility Overal bed mobility: Modified Independent Bed Mobility: Sidelying to Sit;Sit to Sidelying           General bed mobility comments: pt able to perform transfer without assist  Transfers Overall transfer level: Needs assistance   Transfers: Sit to/from Stand Sit to Stand: Supervision         General transfer comment: cues for hand placement and safety  Ambulation/Gait Ambulation/Gait assistance: Supervision Gait Distance (Feet): 200 Feet Assistive device: Rolling walker (2 wheeled) Gait Pattern/deviations: Step-through pattern;Decreased stride length   Gait velocity interpretation: >2.62 ft/sec, indicative of community ambulatory General Gait Details: pt with steady gait with initial cues for posture, bil shoulder elvation   Stairs Stairs: Yes Stairs assistance: Modified independent (Device/Increase time) Stair Management: Alternating pattern;Step to  pattern;One rail Right Number of Stairs: 10 General stair comments: alternating ascending, step to descending   Wheelchair Mobility    Modified Rankin (Stroke Patients Only)       Balance                                            Cognition Arousal/Alertness: Awake/alert Behavior During Therapy: WFL for tasks assessed/performed Overall Cognitive Status: Within Functional Limits for tasks assessed                                        Exercises      General Comments        Pertinent Vitals/Pain Pain Score: 9  Pain Location: incision Pain Descriptors / Indicators: Aching;Guarding Pain Intervention(s): Repositioned;Monitored during session;Premedicated before session    Home Living                      Prior Function            PT Goals (current goals can now be found in the care plan section) Progress towards PT goals: Progressing toward goals    Frequency    Min 5X/week      PT Plan Current plan remains appropriate    Co-evaluation              AM-PAC PT "6 Clicks" Daily Activity  Outcome Measure  Difficulty turning over in bed (including adjusting bedclothes, sheets and blankets)?: A Little Difficulty moving from lying on back  to sitting on the side of the bed? : A Little Difficulty sitting down on and standing up from a chair with arms (e.g., wheelchair, bedside commode, etc,.)?: A Little Help needed moving to and from a bed to chair (including a wheelchair)?: A Little Help needed walking in hospital room?: A Little Help needed climbing 3-5 steps with a railing? : None 6 Click Score: 19    End of Session Equipment Utilized During Treatment: Back brace Activity Tolerance: Patient limited by pain Patient left: in chair;with call bell/phone within reach;with family/visitor present Nurse Communication: Mobility status;Precautions PT Visit Diagnosis: Difficulty in walking, not elsewhere classified  (R26.2)     Time: [id number] PT Time Calculation (min) (ACUTE ONLY): 14 min  Charges:  $Gait Training: 8-22 mins                    G Codes:       Delaney MeigsMaija Tabor Sandrine Bloodsworth, PT [phone]    Catalia Massett B Chella Chapdelaine 03/21/2018, 10:04 AM

## 2018-03-21 NOTE — Op Note (Signed)
03/19/2018  5:04 PM  PATIENT:  Curtis Benson Calandra  52 y.o. male with recurrent spinal stenosis, lateral recess stenosis, and listhesis. Admitted for redo decompression, and arthrodesis.  PRE-OPERATIVE DIAGNOSIS:  Spinal stenosis, Lumbar region with neurogenic claudication, L3/4,4/5  POST-OPERATIVE DIAGNOSIS:  Spinal stenosis, Lumbar region with neurogenic claudication L3/4,4/5  PROCEDURE:  Procedure(s): Lumbar three-four Lumbar four-five Posterior lumbar interbody fusion Opal cages metal, autograft morsels Posterolateral arthrodesis L3-5, allograft morsels Segmental pedicle screw fixation L3-5 Lumbar laminectomy L3, L4, beyond the necessary exposure for a PLIF  SURGEON:  Surgeon(s): Coletta Memosabbell, Modestine Scherzinger, MD Tia AlertJones, David S, MD  ASSISTANTS:Jones, Onalee Huaavid  ANESTHESIA:   general  EBL:  Total I/O In: 1453.9 [P.O.:480; I.V.:973.9] Out: 625 [Urine:625]  BLOOD ADMINISTERED:none  CELL SAVER GIVEN:none  COUNT:per nursing  DRAINS: none   SPECIMEN:  No Specimen  DICTATION: Curtis Benson Vogl is a 52 y.o. male whom was taken to the operating room intubated, and placed under a general anesthetic without difficulty. A foley catheter was placed under sterile conditions. He was positioned prone on a Jackson stable with all pressure points properly padded.  His lumbar region, and old incision was prepped and draped in a sterile manner. I infiltrated 10cc's 1/2%lidocaine/1:2000,000 strength epinephrine into the planned incision. I opened the skin with a 10 blade and took the incision down to the thoracolumbar fascia. I exposed the lamina of L2,3,4 and 5 in a subperiosteal fashion bilaterally. I confirmed my location with an intraoperative xray.  I placed self retaining retractors and started the decompression.  I decompressed the spinal canal in a painstaking fashion dissecting through the scar tissue since he had undergone a lumbar laminectomy and decompression previously. I exposed the remaining portions of the  lamina, and the transverse processes of L3,4, and 5 bilaterally. I used the drill to expose the disc space laterally at both levels, then removed more bone to decompress the spinal canal at L3-5. I used Kerrison punches to unroof the neural foramina bilaterally, and fully decompressed the L3, and L4 nerve roots. I performed complete inferior facetectomies of L3 and L4 bilaterally.  PLIF's were performed at L3/4, and 4/5 in the same fashion. I opened the disc space with a 15 blade then used a variety of instruments to remove the disc and prepare the space for the arthrodesis. I used curettes, rongeurs, punches, shavers for the disc space, and rasps in the discetomy. I measured the disc space and placed Opal  Metal cages(synthes) into the disc space(s). I also packed autograft morsels in the disc spaces. I decorticated the transverse processes at L3,4,and 5. I then placed allograft morsels on the decorticated surfaces to complete the posterolateral arthrodesis.  We placed pedicle screws at L3,4, and 5, using fluoroscopic guidance. I drilled a pilot hole, then cannulated the pedicle with a bone probe at each site. We then tapped each pedicle, assessing each site for pedicle violations. No cutouts were appreciated. Screws (nuvasive) were then placed at each site without difficulty. We attached rods and locking caps with the appropriate tools. The locking caps were secured with torque limited screwdrivers. Final films were performed and the final construct appeared to be in good position.  We closed the wound in a layered fashion. We approximated the thoracolumbar fascia, subcutaneous, and subcuticular planes with vicryl sutures. I used dermabond, and an occlusive bandage for a sterile dressing.     PLAN OF CARE: Admit to inpatient   PATIENT DISPOSITION:  PACU - hemodynamically stable.   Delay start of Pharmacological VTE agent (>  24hrs) due to surgical blood loss or risk of bleeding:  yes

## 2018-05-29 ENCOUNTER — Other Ambulatory Visit (HOSPITAL_COMMUNITY): Payer: Self-pay | Admitting: Neurosurgery

## 2018-05-29 DIAGNOSIS — M48062 Spinal stenosis, lumbar region with neurogenic claudication: Secondary | ICD-10-CM

## 2018-06-13 ENCOUNTER — Ambulatory Visit (HOSPITAL_COMMUNITY)
Admission: RE | Admit: 2018-06-13 | Discharge: 2018-06-13 | Disposition: A | Discharge status: Home / Self Care | Payer: Medicaid Other | Source: Ambulatory Visit | Attending: Neurosurgery | Admitting: Neurosurgery

## 2018-06-13 ENCOUNTER — Other Ambulatory Visit: Payer: Self-pay

## 2018-06-13 DIAGNOSIS — M4726 Other spondylosis with radiculopathy, lumbar region: Secondary | ICD-10-CM | POA: Diagnosis not present

## 2018-06-13 DIAGNOSIS — Z981 Arthrodesis status: Secondary | ICD-10-CM | POA: Insufficient documentation

## 2018-06-13 DIAGNOSIS — M48062 Spinal stenosis, lumbar region with neurogenic claudication: Secondary | ICD-10-CM

## 2018-06-13 DIAGNOSIS — M5116 Intervertebral disc disorders with radiculopathy, lumbar region: Secondary | ICD-10-CM | POA: Insufficient documentation

## 2018-06-13 MED ORDER — DIAZEPAM 5 MG PO TABS
5.0000 mg | ORAL_TABLET | Freq: Once | ORAL | Status: AC
  Administered 2018-06-13: 5 mg via ORAL

## 2018-06-13 MED ORDER — LIDOCAINE HCL (PF) 1 % IJ SOLN
5.0000 mL | Freq: Once | INTRAMUSCULAR | Status: AC
  Administered 2018-06-13: 5 mL via INTRADERMAL

## 2018-06-13 MED ORDER — ONDANSETRON HCL 4 MG/2ML IJ SOLN: 4.0000 mg | Freq: Four times a day (QID) | INTRAMUSCULAR | Status: DC | PRN

## 2018-06-13 MED ORDER — DIAZEPAM 5 MG PO TABS
ORAL_TABLET | ORAL | Status: AC
  Filled 2018-06-13: qty 1

## 2018-06-13 MED ORDER — OXYCODONE-ACETAMINOPHEN 5-325 MG PO TABS
1.0000 | ORAL_TABLET | ORAL | Status: DC | PRN
  Administered 2018-06-13: 1 via ORAL
  Filled 2018-06-13: qty 1

## 2018-06-13 MED ORDER — IOPAMIDOL (ISOVUE-M 200) INJECTION 41%
20.0000 mL | Freq: Once | INTRAMUSCULAR | Status: AC
  Administered 2018-06-13: 20 mL via INTRATHECAL

## 2018-06-13 MED ORDER — OXYCODONE-ACETAMINOPHEN 5-325 MG PO TABS
ORAL_TABLET | ORAL | Status: AC
  Filled 2018-06-13: qty 1

## 2018-06-13 NOTE — Op Note (Signed)
*   No surgery found * Lumbar Myelogram  PATIENT:  Curtis Benson. is a 52 y.o. male with continued pain status post a redo laminectomy and arthrodesis of L3-L5  PRE-OPERATIVE DIAGNOSIS:  Lumbar spondylosis with radiculopathy  POST-OPERATIVE DIAGNOSIS:  same  PROCEDURE:  Lumbar Myelogram  SURGEON:  Alonie Gazzola  ANESTHESIA:   local LOCAL MEDICATIONS USED:  LIDOCAINE  Procedure Note: Curtis Benson. is a 52 y.o. male Was taken to the fluoroscopy suite and  positioned prone on the fluoroscopy table. His back was prepared and draped in a sterile manner. I infiltrated 20 cc into the lumbar region. I then introduced a spinal needle into the thecal sac at the L4/5 interlaminar space. I infiltrated 20cc of Isovue 200 into the thecal sac. Fluoroscopy showed the needle and contrast in the thecal sac. [name] tolerated the procedure well. he Will be taken to CT for evaluation.     PATIENT DISPOSITION:  Short Stay

## 2018-06-13 NOTE — Discharge Instructions (Signed)
Myelogram, Care After °Refer to this sheet in the next few weeks. These instructions provide you with information about caring for yourself after your procedure. Your health care provider may also give you more specific instructions. Your treatment has been planned according to current medical practices, but problems sometimes occur. Call your health care provider if you have any problems or questions after your procedure. °What can I expect after the procedure? °After the procedure, it is common to have: °· Soreness at your injection site. °· A mild headache. ° °Follow these instructions at home: °· Drink enough fluid to keep your urine clear or pale yellow. This will help flush out the dye (contrast material) from your spine. °· Rest as told by your health care provider. Lie flat with your head slightly raised (elevated) to reduce the risk of headache. °· Do not bend, lift, or do any strenuous activity for 24-48 hours or as told by your health care provider. °· Take over-the-counter and prescription medicines only as told by your health care provider. °· Take care of and remove your bandage (dressing) as told by your health care provider. °· Bathe or shower as told by your health care provider. °Contact a health care provider if: °· You have a fever. °· You have a headache that lasts longer than 24 hours. °· You feel nauseous or vomit. °· You have a stiff neck or numbness in your legs. °· You are unable to urinate or have a bowel movement. °· You develop a rash, itching, or sneezing. °Get help right away if: °· You have new symptoms or your symptoms get worse. °· You have a seizure. °· You have trouble breathing. °This information is not intended to replace advice given to you by your health care provider. Make sure you discuss any questions you have with your health care provider. °Document Released: 09/16/2015 Document Revised: 01/26/2016 Document Reviewed: 06/02/2015 °Elsevier Interactive Patient Education ©  2018 [employer] ° °

## 2018-10-27 ENCOUNTER — Other Ambulatory Visit: Payer: Self-pay | Admitting: Neurosurgery

## 2019-01-02 ENCOUNTER — Inpatient Hospital Stay (HOSPITAL_COMMUNITY): Admission: RE | Admit: 2019-01-02 | Payer: Medicaid Other | Source: Ambulatory Visit

## 2019-02-06 ENCOUNTER — Encounter (HOSPITAL_COMMUNITY): Admission: RE | Payer: Self-pay | Source: Home / Self Care

## 2019-02-06 ENCOUNTER — Inpatient Hospital Stay (HOSPITAL_COMMUNITY): Admission: RE | Admit: 2019-02-06 | Payer: Medicaid Other | Source: Home / Self Care | Admitting: Pediatrics

## 2019-02-06 SURGERY — POSTERIOR LUMBAR FUSION 1 LEVEL
Anesthesia: General

## 2019-03-31 IMAGING — CT CT L SPINE W/ CM
3 series · 12 of 33 positions shown, 14 images · IV contrast (Omni 300)
Comparison: Images 03/19/18. Preoperative MRI 02/06/2018.

CLINICAL DATA: 52-year-old male with low back pain radiating to
TECHNIQUE: Contiguous axial images were obtained through the Lumbar spine after
the intrathecal infusion of infusion. Coronal and sagittal
reconstructions were obtained of the axial image sets.

[Series 11: l-spine 2.0 (person_name) (person_name) · axial · 0.37mm/px · z∈[+938,+1096]mm · 4 of 115 slices shown, 5 images]
[im 18/115  soft-tissue]
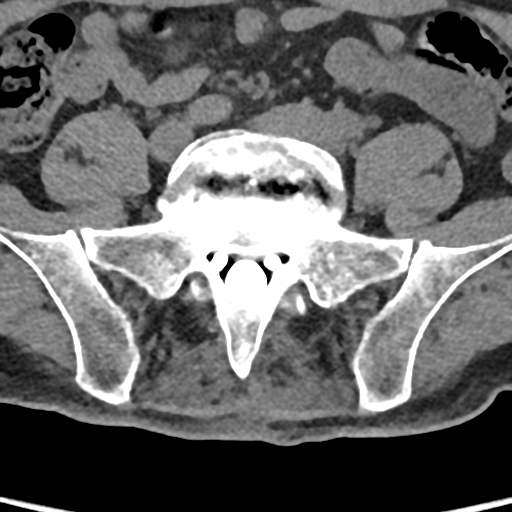
[im 18/115  bone]
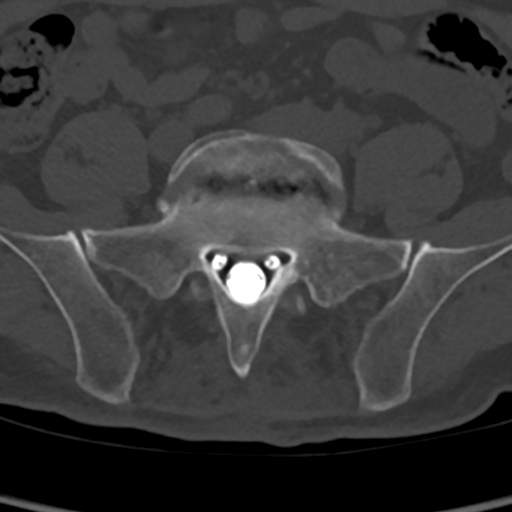
[im 44/115  bone]
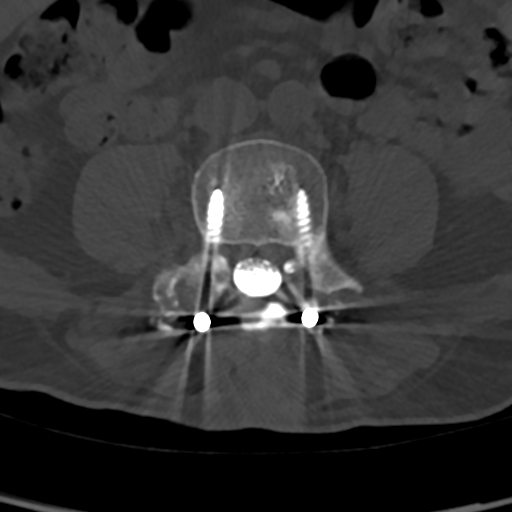
[im 71/115  bone]
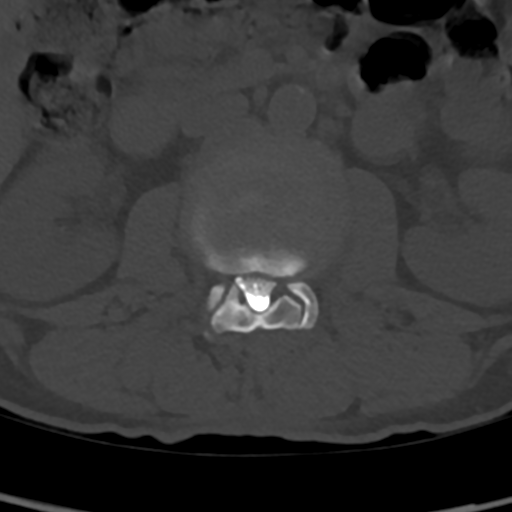
[im 97/115  bone]
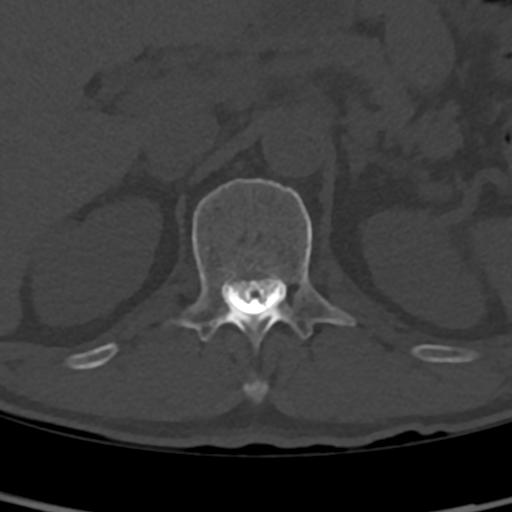

[Series 15: l-spine 2.0 cor bone · coronal · 0.34mm/px · 3 of 73 slices shown]
[im 15/73  bone]
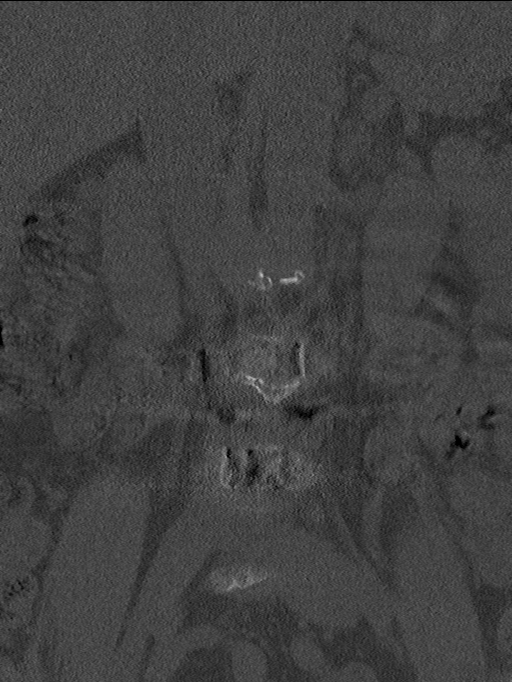
[im 29/73  bone]
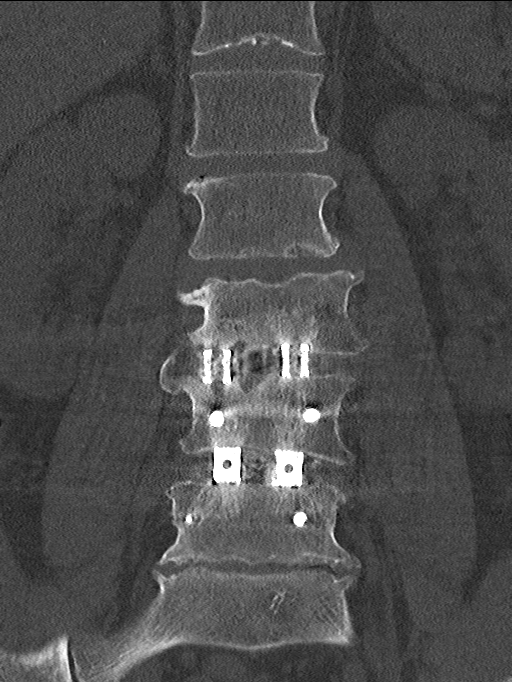
[im 44/73  bone]
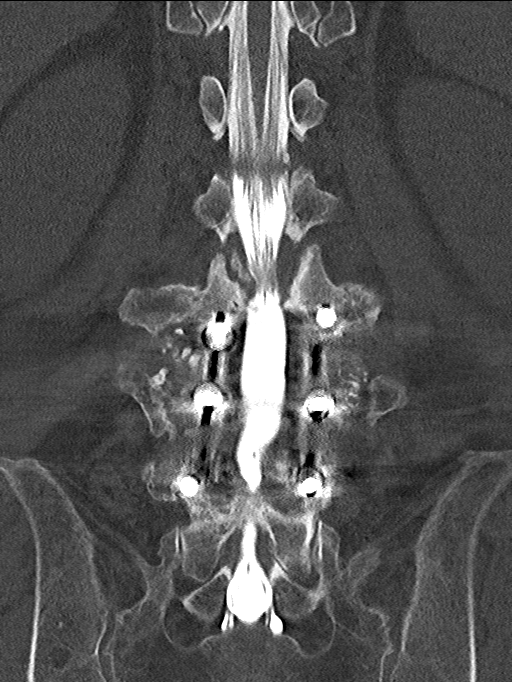

[Series 16: l-spine 2.0 sag bone · sagittal · 0.32mm/px · 5 of 101 slices shown, 6 images]
[im 34/101  bone]
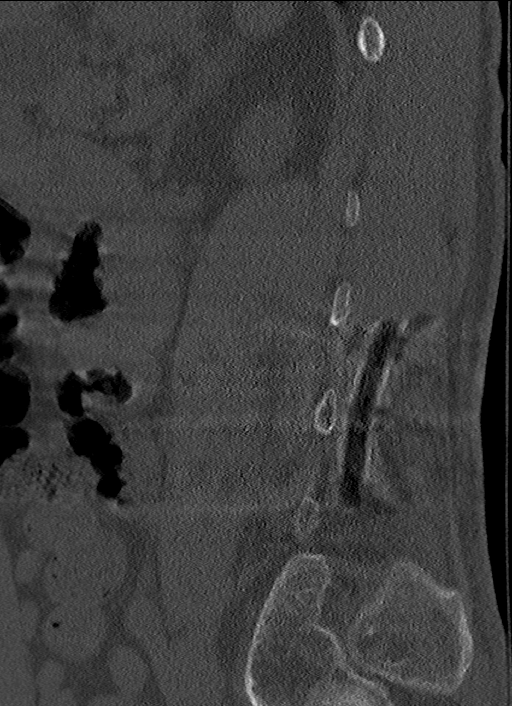
[im 42/101  bone]
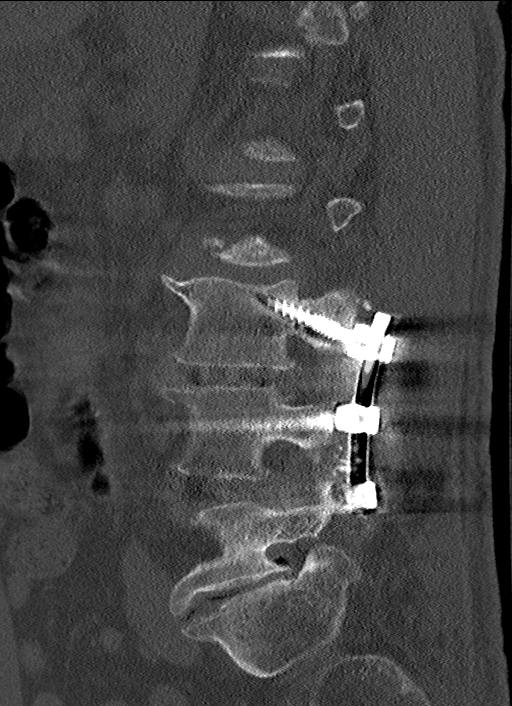
[im 51/101  soft-tissue]
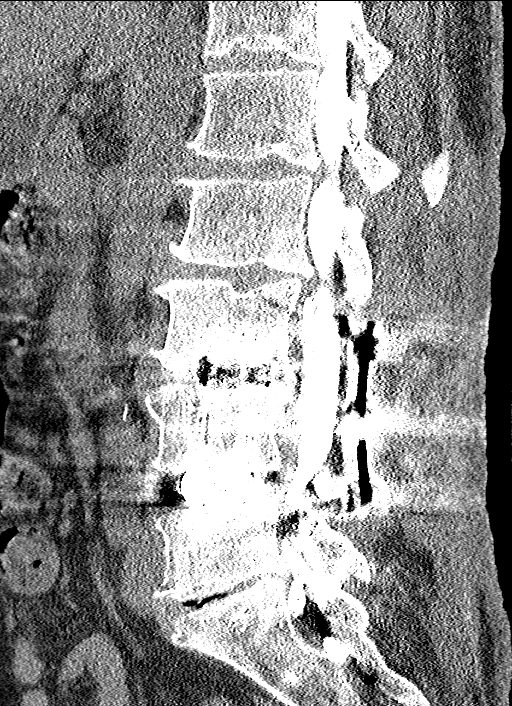
[im 51/101  bone]
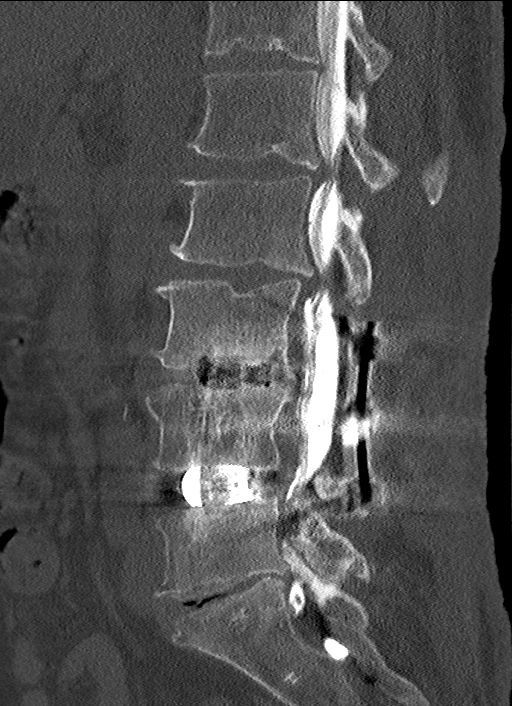
[im 59/101  bone]
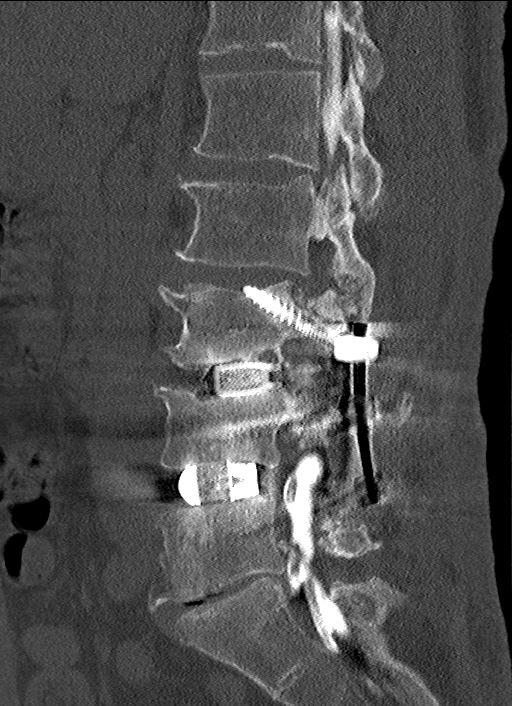
[im 67/101  bone]
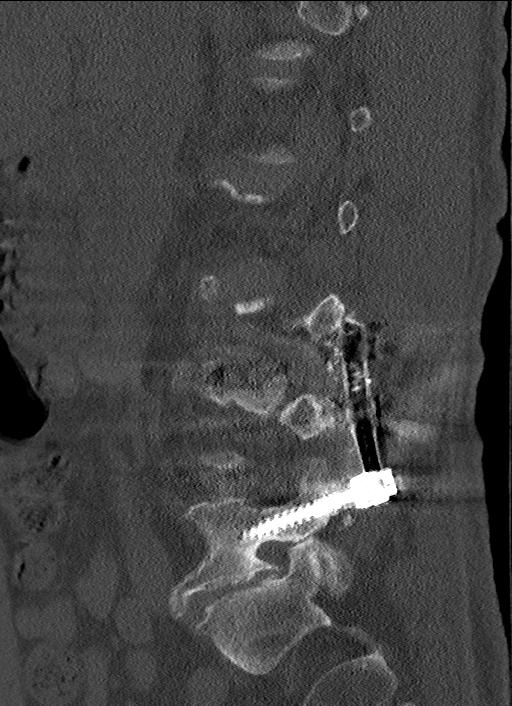

[12 of 33 positions shown; findings below may reference images not displayed]

both hips and down both legs. Lumbar surgery in [REDACTED] this year.

EXAM:
LUMBAR MYELOGRAM

FLUOROSCOPY TIME:  0 minutes 30 seconds

PROCEDURE:
Lumbar puncture and intrathecal contrast administration were

performed by Dr. ICYCHACKO PUJASARI who will separately report for the

portion of the procedure. I personally supervised acquisition of the

myelogram images.
FINDINGS: LUMBAR MYELOGRAM FINDINGS:

Normal lumbar segmentation. On lateral and upright views there is

stable vertebral height and alignment since the Wang radiographs.

Sequelae of L3-L4 and L4-L5 decompression and fusion. Hardware

position appears stable. There is questionable lucency along the

right L4 pedicle screw (image 4), see CT findings.

Good intrathecal contrast opacification. Moderate mass effect on the

ventral thecal sac at L1-L2 and L2-L3 with disc space loss and

endplate spurring at both levels. No spinal stenosis at the operated

levels.

Upright neutral flexion and extension views obtained. No abnormal

motion identified.

CT LUMBAR MYELOGRAM FINDINGS:

Postoperative changes are described below. No acute osseous

abnormality identified. Intact visible sacrum and SI joints.

Mild Calcified aortic atherosclerosis. Negative visible noncontrast

abdominal viscera.

Good intrathecal contrast opacification. Normal myelographic

appearance of the lower thoracic spinal cord with conus at L1.

T12-L1: Stable minimal disc bulging.

L1-L2: Disc space loss with circumferential disc bulge and endplate

spurring. Mild facet and ligament flavum hypertrophy. Stable mild

spinal stenosis with left greater than right mild lateral recess

stenosis (left L2 nerve level).

L2-L3: Disc space loss with circumferential disc bulge and endplate

spurring. Chronic postoperative changes to the lamina with moderate

residual posterior element hypertrophy. Borderline to mild spinal

stenosis but moderate left lateral recess stenosis (left L3 nerve

level, series 12 image 47). Mild bilateral L2 foraminal stenosis.

L3-L4: Sequelae of decompression and fusion. There is mild lucency

along the course of both L3 pedicle screws. The right screw tip

extends through the superior endplate of L3. See series 12, image 50

and sagittal image 61. Interbody implant in place. There is some

posterior interbody calcification suggesting developing arthrodesis.

No residual stenosis is evident.

L4-L5: Sequelae of decompression and fusion. No lucency along the

pedicle screws. Interbody implant in place with some posterior

interbody calcification suggesting developing arthrodesis. No

stenosis.

L5-S1: Severe disc space loss with subarticular and far lateral disc

osteophyte complex. Vacuum disc. Chronic postoperative changes to

the lamina. No spinal or lateral recess stenosis. Superimposed small

focus of gas in the left L5 neural foramen (series 12, image 92 and

sagittal image 44). Moderate to severe associated left neural

foraminal stenosis. Similar at least moderate right L5 neural

foraminal stenosis but mostly related to facet hypertrophy.
IMPRESSION: LUMBAR MYELOGRAM IMPRESSION:

1. Previous L3-L4 and L4-L5 decompression and fusion. No abnormal

motion in flexion, extension.

2. Mass effect on the ventral thecal sac related to disc and

endplate degeneration at L1-L2 and L2-L3.

CT LUMBAR MYELOGRAM IMPRESSION:

1. Mild lucency along the course of both L3 pedicle screws might

indicate loosening. But there does appear to be developing

arthrodesis at the posterior interbody spaces of both L3-L4 and

L4-L5.

2. Advanced chronic L5-S1 disc and endplate degeneration. Small

focus of gas in the left L5 neural foramen suspicious for foraminal

disc protrusion. Query left L5 radiculitis.

3. Chronic disc and endplate degeneration at L1-L2 and L2-L3

resulting in up to mild spinal stenosis with left greater than right

lateral recess stenosis at both levels.
